# Patient Record
Sex: Female | Born: 1965 | Race: White | Hispanic: No | Marital: Married | Smoking: Never smoker
Health system: Southern US, Community
[De-identification: ages and names within clinical notes are randomized; demographics above are authoritative.]

## PROBLEM LIST (undated history)

## (undated) DIAGNOSIS — N809 Endometriosis, unspecified: Secondary | ICD-10-CM

## (undated) DIAGNOSIS — M1990 Unspecified osteoarthritis, unspecified site: Secondary | ICD-10-CM

## (undated) DIAGNOSIS — E079 Disorder of thyroid, unspecified: Secondary | ICD-10-CM

## (undated) DIAGNOSIS — K429 Umbilical hernia without obstruction or gangrene: Secondary | ICD-10-CM

## (undated) DIAGNOSIS — J45909 Unspecified asthma, uncomplicated: Secondary | ICD-10-CM

## (undated) HISTORY — PX: TYMPANOSTOMY TUBE PLACEMENT: SHX32

## (undated) HISTORY — DX: Unspecified osteoarthritis, unspecified site: M19.90

## (undated) HISTORY — PX: TOTAL SHOULDER ARTHROPLASTY: SHX126

## (undated) HISTORY — DX: Unspecified asthma, uncomplicated: J45.909

## (undated) HISTORY — DX: Umbilical hernia without obstruction or gangrene: K42.9

## (undated) HISTORY — DX: Endometriosis, unspecified: N80.9

## (undated) HISTORY — DX: Disorder of thyroid, unspecified: E07.9

---
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
## 1987-08-13 DIAGNOSIS — N809 Endometriosis, unspecified: Secondary | ICD-10-CM

## 1987-08-13 HISTORY — DX: Endometriosis, unspecified: N80.9

## 1988-08-12 HISTORY — PX: APPENDECTOMY: SHX54

## 1988-08-12 HISTORY — PX: CHOLECYSTECTOMY: SHX55

## 1998-12-29 ENCOUNTER — Encounter: Payer: Self-pay | Admitting: *Deleted

## 1998-12-29 ENCOUNTER — Ambulatory Visit (HOSPITAL_COMMUNITY): Admission: RE | Admit: 1998-12-29 | Discharge: 1998-12-29 | Payer: Self-pay | Admitting: *Deleted

## 1999-08-13 DIAGNOSIS — K429 Umbilical hernia without obstruction or gangrene: Secondary | ICD-10-CM

## 1999-08-13 HISTORY — DX: Umbilical hernia without obstruction or gangrene: K42.9

## 1999-08-13 HISTORY — PX: UMBILICAL HERNIA REPAIR: SHX196

## 2003-05-20 ENCOUNTER — Emergency Department (HOSPITAL_COMMUNITY): Admission: EM | Admit: 2003-05-20 | Discharge: 2003-05-20 | Payer: Self-pay | Admitting: Emergency Medicine

## 2004-04-23 ENCOUNTER — Ambulatory Visit (HOSPITAL_COMMUNITY): Admission: RE | Admit: 2004-04-23 | Discharge: 2004-04-23 | Payer: Self-pay | Admitting: *Deleted

## 2004-04-30 ENCOUNTER — Encounter: Admission: RE | Admit: 2004-04-30 | Discharge: 2004-07-29 | Payer: Self-pay | Admitting: *Deleted

## 2004-05-04 ENCOUNTER — Ambulatory Visit (HOSPITAL_COMMUNITY): Admission: RE | Admit: 2004-05-04 | Discharge: 2004-05-04 | Payer: Self-pay | Admitting: *Deleted

## 2004-05-04 ENCOUNTER — Encounter: Payer: Self-pay | Admitting: Cardiology

## 2004-05-07 ENCOUNTER — Ambulatory Visit (HOSPITAL_COMMUNITY): Admission: RE | Admit: 2004-05-07 | Discharge: 2004-05-07 | Payer: Self-pay | Admitting: *Deleted

## 2004-08-12 HISTORY — PX: BREAST LUMPECTOMY: SHX2

## 2005-07-12 ENCOUNTER — Encounter: Admission: RE | Admit: 2005-07-12 | Discharge: 2005-07-12 | Payer: Self-pay | Admitting: Orthopedic Surgery

## 2005-08-12 HISTORY — PX: ROUX-EN-Y GASTRIC BYPASS: SHX1104

## 2006-01-10 ENCOUNTER — Emergency Department (HOSPITAL_COMMUNITY): Admission: EM | Admit: 2006-01-10 | Discharge: 2006-01-10 | Payer: Self-pay | Admitting: Emergency Medicine

## 2006-05-23 ENCOUNTER — Encounter: Admission: RE | Admit: 2006-05-23 | Discharge: 2006-08-21 | Payer: Self-pay | Admitting: *Deleted

## 2006-07-22 ENCOUNTER — Inpatient Hospital Stay (HOSPITAL_COMMUNITY): Admission: RE | Admit: 2006-07-22 | Discharge: 2006-07-24 | Payer: Self-pay | Admitting: *Deleted

## 2006-07-27 ENCOUNTER — Inpatient Hospital Stay (HOSPITAL_COMMUNITY): Admission: EM | Admit: 2006-07-27 | Discharge: 2006-07-30 | Payer: Self-pay | Admitting: Emergency Medicine

## 2006-10-01 ENCOUNTER — Encounter: Admission: RE | Admit: 2006-10-01 | Discharge: 2006-12-30 | Payer: Self-pay | Admitting: *Deleted

## 2007-04-09 ENCOUNTER — Encounter: Admission: RE | Admit: 2007-04-09 | Discharge: 2007-04-09 | Payer: Self-pay | Admitting: Rheumatology

## 2007-06-26 ENCOUNTER — Encounter: Admission: RE | Admit: 2007-06-26 | Discharge: 2007-06-26 | Payer: Self-pay | Admitting: Neurology

## 2007-07-14 ENCOUNTER — Encounter: Admission: RE | Admit: 2007-07-14 | Discharge: 2007-07-14 | Payer: Self-pay | Admitting: Neurology

## 2007-07-28 ENCOUNTER — Encounter: Admission: RE | Admit: 2007-07-28 | Discharge: 2007-07-28 | Payer: Self-pay | Admitting: Neurology

## 2007-09-21 ENCOUNTER — Encounter: Admission: RE | Admit: 2007-09-21 | Discharge: 2007-09-21 | Payer: Self-pay | Admitting: Neurosurgery

## 2007-10-05 ENCOUNTER — Ambulatory Visit (HOSPITAL_COMMUNITY): Admission: RE | Admit: 2007-10-05 | Discharge: 2007-10-05 | Payer: Self-pay | Admitting: Orthopedic Surgery

## 2007-10-29 HISTORY — PX: BACK SURGERY: SHX140

## 2007-11-03 ENCOUNTER — Inpatient Hospital Stay (HOSPITAL_COMMUNITY): Admission: RE | Admit: 2007-11-03 | Discharge: 2007-11-09 | Payer: Self-pay | Admitting: Orthopedic Surgery

## 2008-08-12 HISTORY — PX: ANTERIOR AND POSTERIOR VAGINAL REPAIR: SUR5

## 2008-08-24 IMAGING — CR DG CHEST 2V
2 series · 2 of 2 positions shown · non-contrast
Comparison: 01/10/06

CLINICAL DATA: Preoperative respiratory exam for spinal surgery.
 CHEST - 2 VIEW:

[view not recorded (1 of 2)]
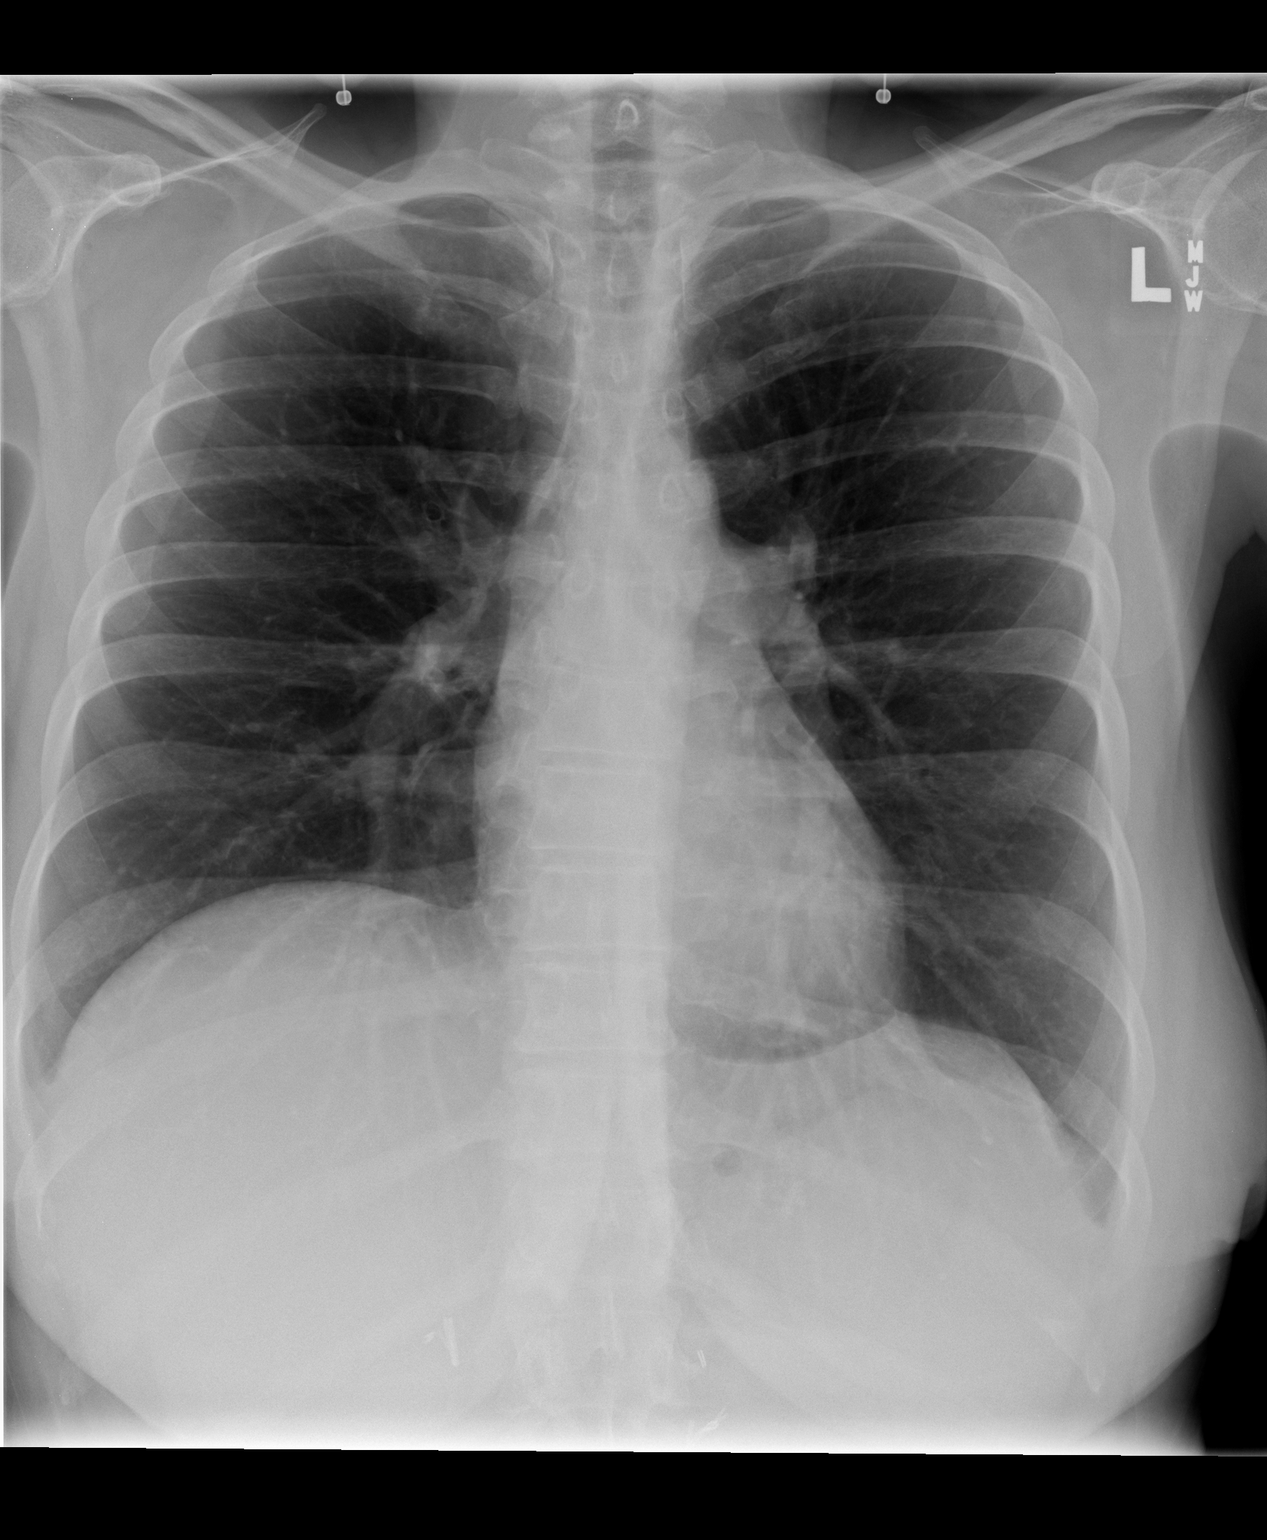

[view not recorded (2 of 2)]
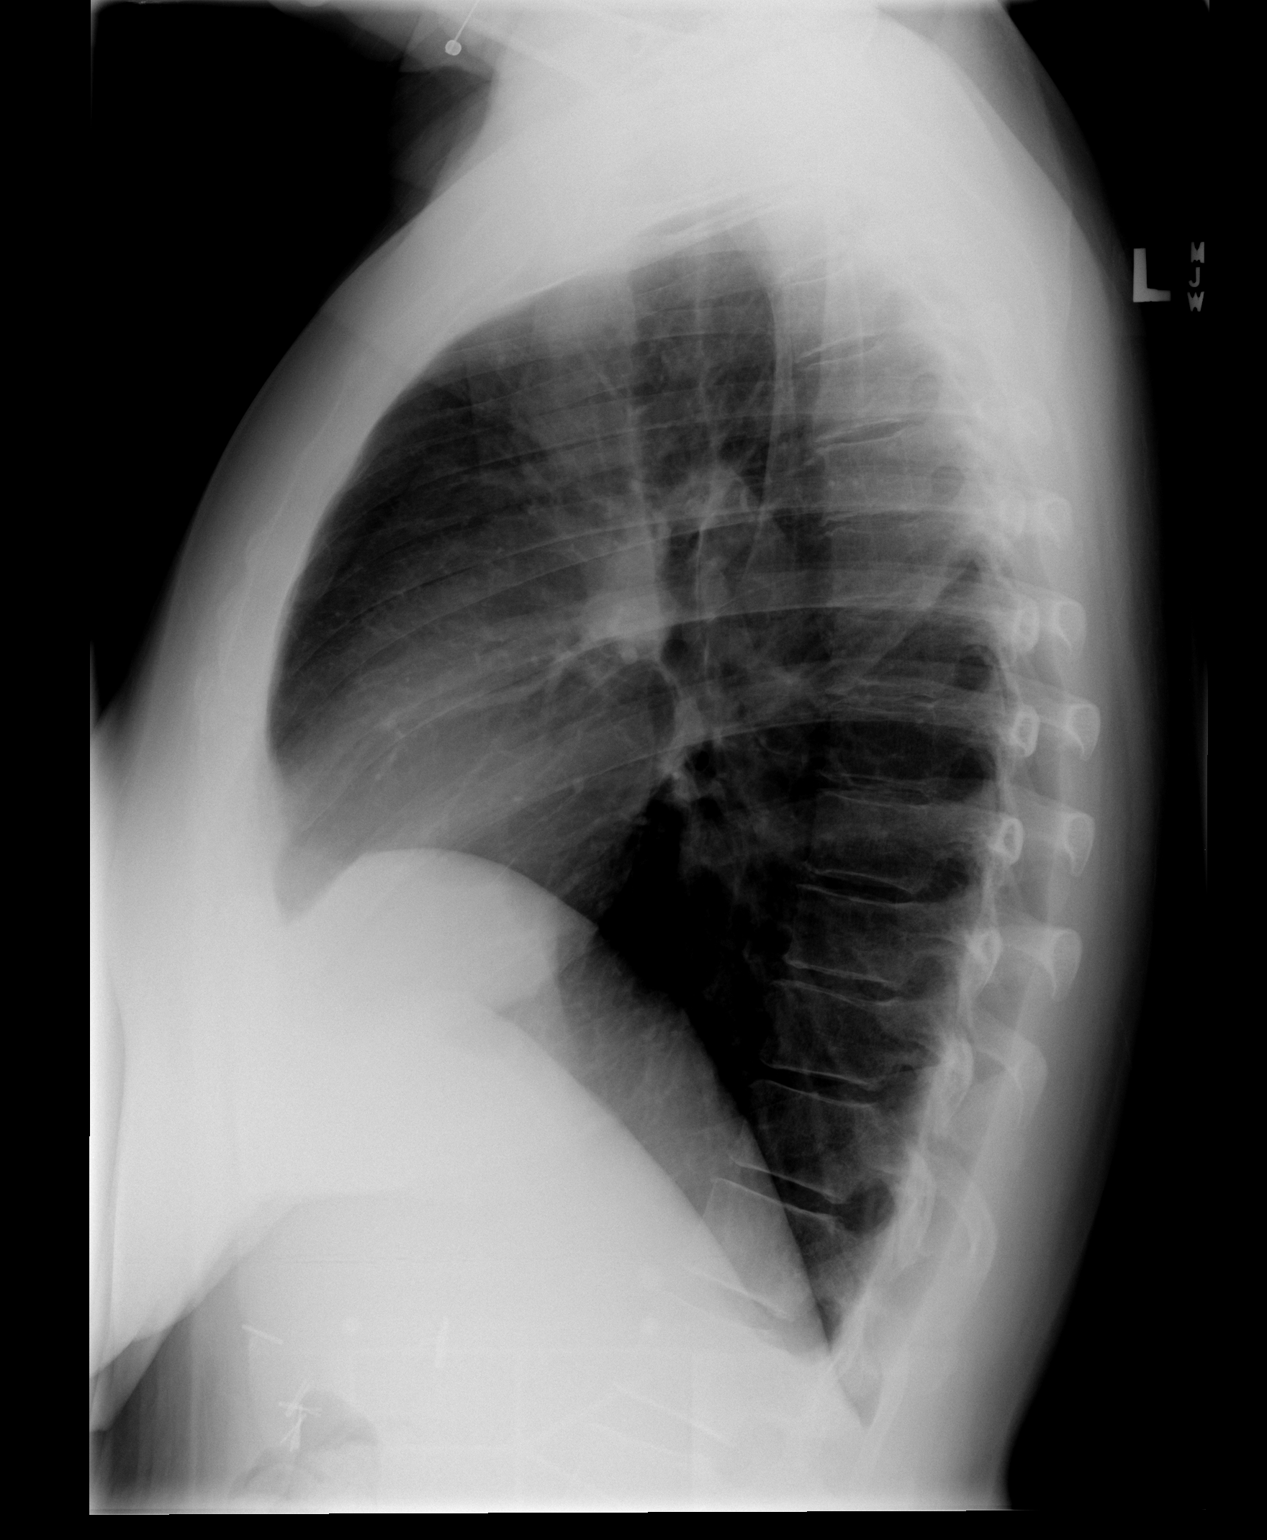

[2 of 2 positions shown; findings below may reference images not displayed]

FINDINGS: Heart size is normal. The mediastinum is unremarkable. The lungs are clear.  No effusions.  Bony structures unremarkable. Surgical clips noted in the abdomen.
IMPRESSION: No active disease.

## 2008-08-28 IMAGING — CR DG LUMBAR SPINE COMPLETE 4+V
1 series · 1 of 1 positions shown · non-contrast
Comparison: Radiographs [DATE] 1558 and MRI 04/09/2007

CLINICAL DATA: LUMBAR SPONDYLOSIS;  SURGICAL FUSION.  NEURAL COUNT
ON LAST FILM.

LUMBAR SPINE - COMPLETE 4+ VIEW

[view not recorded]
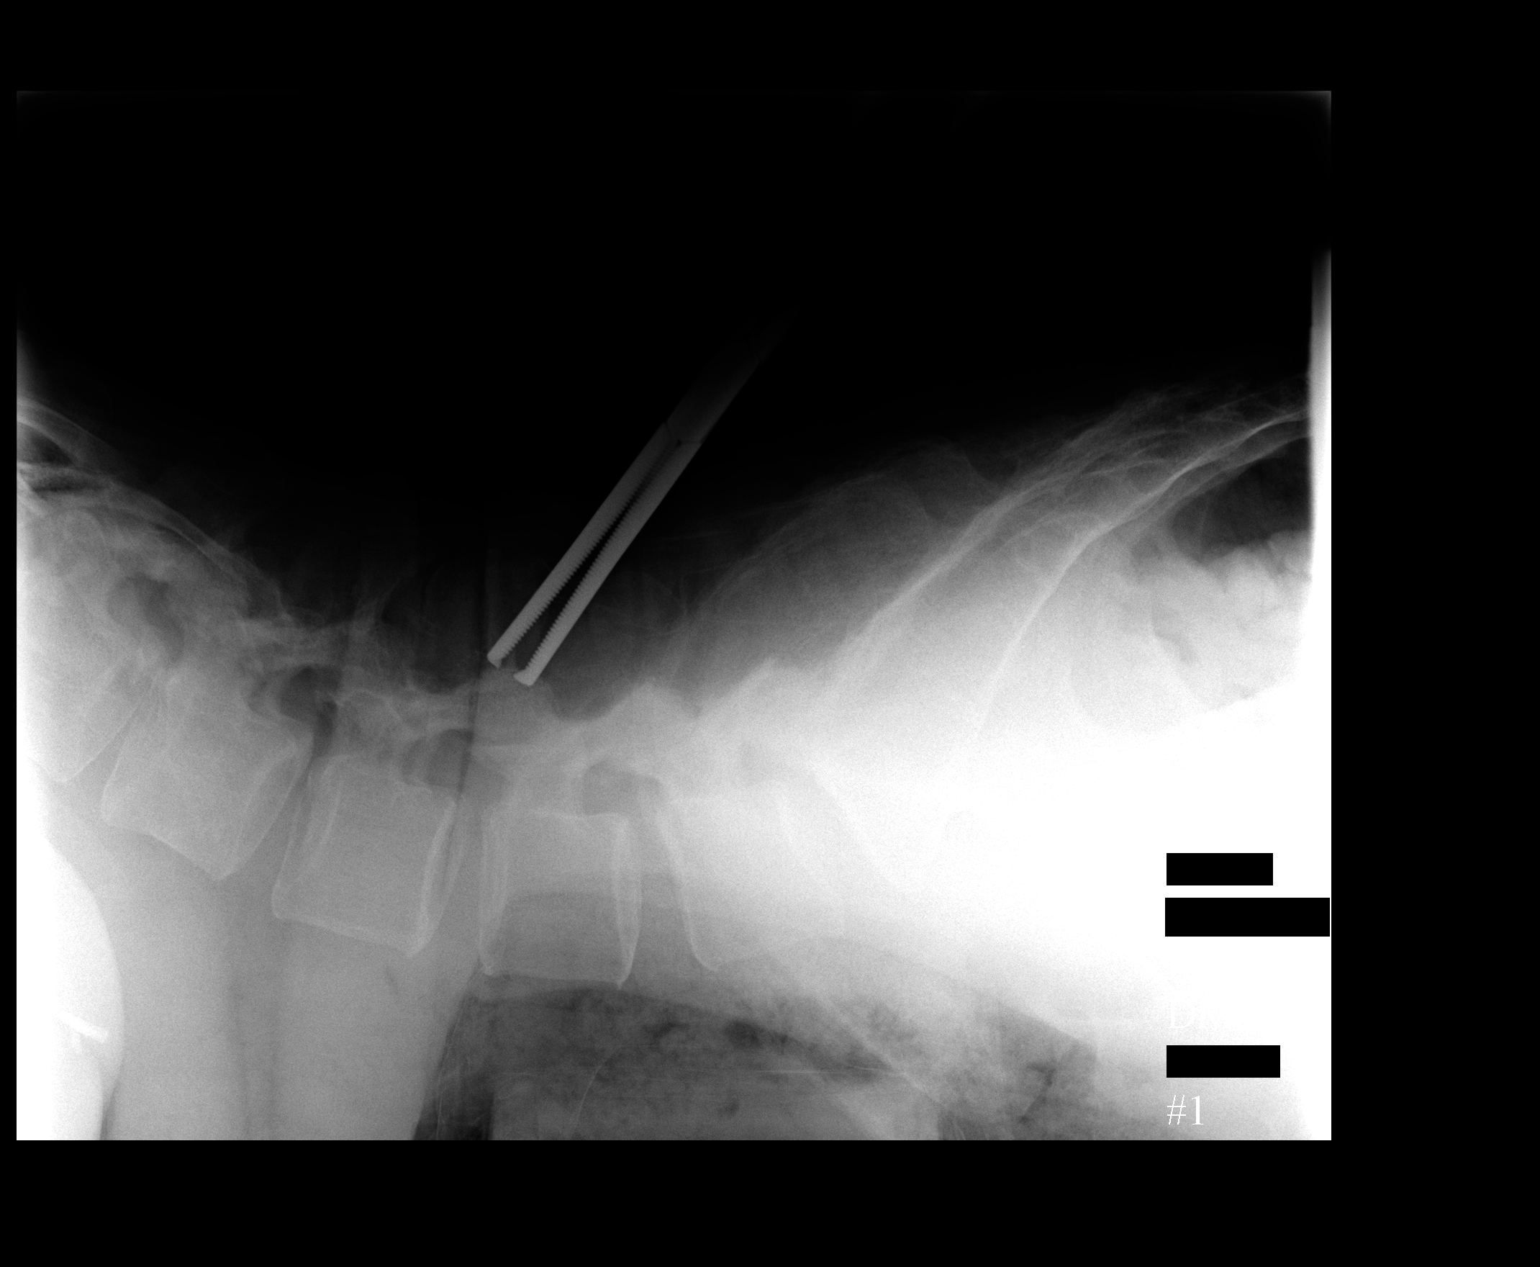

[1 of 1 positions shown; findings below may reference images not displayed]

FINDINGS: Initial film shows a clamp between the spinous processes
of L3 and L4.

A second film shows sounders in  place in the pedicles of L4 and
L5.

A third film shows pedicle screws and rods in place at L4 and L5 in
the ap projection.

The fourth film shows pedicle screws and posterior rods in place at
the L4-5 level on the lateral projection.

A 5th film is under penetrated.  Artifact overlies the region.  I
cannot see a needle, but this is not a high detail film.
IMPRESSION:

## 2009-03-12 HISTORY — PX: HERNIA REPAIR: SHX51

## 2009-08-12 HISTORY — PX: HERNIA REPAIR: SHX51

## 2009-08-12 HISTORY — PX: ABDOMINAL HYSTERECTOMY: SHX81

## 2010-05-12 HISTORY — PX: ANTERIOR AND POSTERIOR VAGINAL REPAIR: SUR5

## 2010-10-12 ENCOUNTER — Ambulatory Visit: Payer: Self-pay | Admitting: Hematology & Oncology

## 2010-10-29 ENCOUNTER — Other Ambulatory Visit: Payer: Self-pay | Admitting: Hematology & Oncology

## 2010-10-29 ENCOUNTER — Ambulatory Visit (HOSPITAL_BASED_OUTPATIENT_CLINIC_OR_DEPARTMENT_OTHER): Payer: BC Managed Care – PPO | Admitting: Hematology & Oncology

## 2010-10-29 DIAGNOSIS — D72819 Decreased white blood cell count, unspecified: Secondary | ICD-10-CM

## 2010-10-29 DIAGNOSIS — D696 Thrombocytopenia, unspecified: Secondary | ICD-10-CM

## 2010-10-29 DIAGNOSIS — D649 Anemia, unspecified: Secondary | ICD-10-CM

## 2010-10-29 LAB — MANUAL DIFFERENTIAL (CHCC SATELLITE)
ALC: 0.3 10*3/uL — ABNORMAL LOW (ref 0.6–2.2)
Eos: 2 % (ref 0–7)
Metamyelocytes: 1 % — ABNORMAL HIGH (ref 0–0)
Myelocytes: 1 % — ABNORMAL HIGH (ref 0–0)
Other Cells: 7 % — ABNORMAL HIGH (ref 0–0)
SEG: 48 % (ref 40–75)

## 2010-10-29 LAB — CBC WITH DIFFERENTIAL (CANCER CENTER ONLY)
MCHC: 33.1 g/dL (ref 32.0–36.0)
MCV: 94 fL (ref 81–101)
Platelets: 45 10*3/uL — ABNORMAL LOW (ref 145–400)
RDW: 15.5 % (ref 11.1–15.7)
WBC: 0.8 10*3/uL — CL (ref 3.9–10.0)

## 2010-10-31 LAB — PROTEIN ELECTROPHORESIS, SERUM
Beta 2: 6.2 % (ref 3.2–6.5)
Gamma Globulin: 10.1 % — ABNORMAL LOW (ref 11.1–18.8)

## 2010-10-31 LAB — COMPREHENSIVE METABOLIC PANEL
ALT: 17 U/L (ref 0–35)
AST: 15 U/L (ref 0–37)
Albumin: 3.9 g/dL (ref 3.5–5.2)
Calcium: 8.8 mg/dL (ref 8.4–10.5)
Chloride: 110 mEq/L (ref 96–112)
Potassium: 3.4 mEq/L — ABNORMAL LOW (ref 3.5–5.3)
Sodium: 142 mEq/L (ref 135–145)
Total Protein: 6.2 g/dL (ref 6.0–8.3)

## 2010-10-31 LAB — RETICULOCYTES (CHCC): ABS Retic: 32.1 10*3/uL (ref 19.0–186.0)

## 2010-10-31 LAB — LACTATE DEHYDROGENASE: LDH: 184 U/L (ref 94–250)

## 2010-11-01 ENCOUNTER — Ambulatory Visit (HOSPITAL_COMMUNITY): Payer: BC Managed Care – PPO | Attending: Hematology & Oncology

## 2010-11-01 ENCOUNTER — Other Ambulatory Visit: Payer: Self-pay | Admitting: Hematology & Oncology

## 2010-11-01 DIAGNOSIS — C9200 Acute myeloblastic leukemia, not having achieved remission: Secondary | ICD-10-CM | POA: Insufficient documentation

## 2010-11-01 DIAGNOSIS — D61818 Other pancytopenia: Secondary | ICD-10-CM | POA: Insufficient documentation

## 2010-11-01 LAB — CBC
MCH: 30.3 pg (ref 26.0–34.0)
MCHC: 31.9 g/dL (ref 30.0–36.0)
RDW: 15.5 % (ref 11.5–15.5)

## 2010-11-07 LAB — JAK-2 V617F

## 2010-11-08 NOTE — Op Note (Signed)
  NAME:  Cindy Nunez, Cindy Nunez               ACCOUNT NO.:  [id number]  MEDICAL RECORD NO.:  [id number]           PATIENT TYPE:  O  LOCATION:  MDC                          FACILITY:  [facility]  PHYSICIAN:  Rose Phi. Myna Hidalgo, M.D. DATE OF BIRTH:  Dec 23, 1965  DATE OF PROCEDURE:  11/01/2010 DATE OF DISCHARGE:                              OPERATIVE REPORT   PROCEDURE:  Left posterior iliac crest bone marrow biopsy and aspirate.  [name] was brought to short stay unit.  She had an IV placed without any difficulty.  Her Mallampati score was 1.  Her ASA class was 1.  We did the appropriate time-out procedure.  She was then placed onto her right side.  She received a total of 6 mg of Versed and 25 mg Demerol for IV sedation.  The left posterior iliac crest region was prepped and draped in sterile fashion.  10 cc of 2% lidocaine was infiltrated on the skin down to the periosteum.  I did need an 22-gauge spinal needle to reach the periosteum.  A #11 scalpel was used to make an incision into the skin.  We obtained 2 excellent bone marrow biopsy and 2 bone marrow aspirates without any difficulty.  We then used a Jamshidi biopsy needle.  We obtained an excellent bone marrow biopsy core.  The patient tolerated the procedure well.  Despite her thrombocytopenia, there was no bleeding that occurred.     Rose Phi. Myna Hidalgo, M.D.     PRE/MEDQ  D:  11/01/2010  T:  11/01/2010  Job:  [id number]  Electronically Signed by Arlan Organ  on 11/08/2010 07:47:44 AM

## 2010-12-25 NOTE — Op Note (Signed)
Cindy Nunez, Cindy Nunez NO.:  [id number]   MEDICAL RECORD NO.:  [id number]          PATIENT TYPE:  INP   LOCATION:  2550                         FACILITY:  [facility]   PHYSICIAN:  Nelda Severe, MD      DATE OF BIRTH:  1966-02-14   DATE OF PROCEDURE:  DATE OF DISCHARGE:  11/03/2007                               OPERATIVE REPORT   SURGEON:  Nelda Severe, M.D.   ASSISTANT:  Lianne Cure, PA-C.   PREOPERATIVE DIAGNOSIS:  Lumbar spondylosis, L4-5.   POSTOPERATIVE DIAGNOSIS:  Lumbar spondylosis, L4-5.   OPERATION/PROCEDURE:  Posterolateral and posterior fusion, L4-5, with  right posterior iliac crest autogenous graft, infuse, pedicle screws and  rods (in thickness).   DESCRIPTION OF PROCEDURE:  The patient was identified in the  preoperative room and paperwork signed including consent.  The informed  consent process was  previously executed in the office.  In the  operating room she is placed under general endotracheal anesthesia.  Prophylactic intravenous antibiotics had been infused.  A Foley catheter  was placed in the bladder.  Bilateral sequential compression devices  were placed on both lower extremities.   She was positioned prone on the [place] frame.  Care was taken to  position the upper extremities so as to avoid hyperflexion and abduction  of shoulders and so as to avoid hyperflexion of the elbows.  Upper  extremities were padded with foam from axilla to hands.  There was no  pressure on the cubital tunnels.  The hips and knees were gently flexed.  Thighs and knees and feet were supported on pillows.   The site of the proposed midline lumbar incision was marked with a skin  marker.  The lumbar area was prepped with DuraPrep and draped in  rectangular fashion.  The drapes were secured with Ioban.   The skin incision was scored and the dermis and subcuticular tissue  injected with a mixture of 0.25% plain Marcaine and 1% lidocaine with  epinephrine.   Incision was carried down to the spinous processes of the  distal lumbar spine.  A Kocher clamp was placed on the trailing edge of  what turned out to be the L3 lamina and an x-ray taken identifying this.  By this time the x-ray returned.  We exposed the lumbar spine  bilaterally to the sacrum and the position of the Kocher on the L3  lamina was consistent with our impression of the last mobile segment at  L5-S1.   We then exposed the transverse processes of L4-L5 bilaterally.   I then made pedicle holes on the left side at L5 and L4.  This was done  in the usual fashion by using a high-speed bur to expose the posterior  pedicle at the junction of the transverse process and superior articular  process.  A perforation was made with an awl and then the pedicle-  seeking probe introduced to make a pedicle hole through the pedicle into  the vertebral body.  The hole was carefully sounded with a ball-tip  probe and measured for depth and the depths recorded.  In the same way a  pedicle hole was created a left L4, right L5 and right L4.  Metal  markers were placed in each hole and cross-table lateral radiograph  taken which showed satisfactory position of markers.   In the meantime we harvested the right posterior iliac crest graft  through the same incision.  A flap of the skin and subcutaneous tissue  was raised and the iliac crest approach with cutting current.  The  gluteal fascia was detached from the outer iliac crest, right side.  The  muscle was then elevated off the outer table of the ilium and a Taylor  retractor replaced.  A medium gouge was then used to harvest a  moderately large quantity of cortical cancellous graft.  The graft site  was irrigated with saline and then the bony defect packed with Gelfoam  to control bleeding.  Gluteal fascia was reattached to the iliac crest  using three #1 Vicryl sutures in figure-of-eight fashion.   Next we used a high-speed bur to  decorticate the transverse processes on  the right side of the L4 and L5 in the lateral aspect of the superior  articular processes of L4 and L5.  Moderate quantity of morcellized  cancellous graft was then packed in posterolaterally.  We then placed  screws, 6.5 mm in diameter, the length determined by the depth  measurements at L5 and L4.  These were stimulated and distal EMG  activity monitored.  The level which distal EMG activity was elicited  was in the safe zone. There was very little likelihood of contact  between screw thread and nerve.  A 40 mm rod was then provisionally  attached.   We then moved back to the left side of the table where exactly the same  procedure was carried out, decorticating the transverse processes and  then lateral aspect of the superior articular processes.  Again graft  was placed posterolaterally.  Screws were then inserted and stimulated.  Current level at which distal EMG activity was elicited was in a safe  zone.  We then provisionally attached a rod.   At this point cross-table lateral and PA radiographs were taken, showed  satisfactory position of implants.   The couplings were then all torqued.   Next I resected the articular surfaces of the apophyseal joints at L4-5  bilaterally using a high-speed bur and  decorticated the lamina of L4-  L5.  I then had had prepared from a small pack of infuse to collagen  fiber strips saturated with infuse.  Bone was packed in the defect on  the right and then on the left, that is in facet joint resected area.  I  then packed in the rest of the graft superficially, interposing it into  the defect at the facet joints bilaterally in contact with the infuse  soaked collagen sponge.   We then placed a 15 Blake drain subfascially and brought out through the  skin to the right side.  It was secured with a 2-0 nylon suture.  The  thoracolumbar fascia was then closed using continuous #1 Vicryl suture.  A 1/8-inch  Hemovac drain was then placed in the subcutaneous layer and  brought out just to the right at the very proximal end of the wound.  We  then tacked down the skin/subcutaneous flap and used to approach the  iliac crest wound to the thoracolumbar fascia using interrupted 2-0  Vicryl sutures.  The subcutaneous layer of the wound was  then closed  side-to-side using interrupted 2-0 Vicryl suture in inverted fashion.  The skin was then closed using a subcuticular 3-0 undyed Vicryl suture  in continuous fashion, taking great care to try to match up the tattoo  through which the incision made, a butterfly.  The 1/8-inch Hemovac  drain was secured with a #2-0 nylon suture in basket weave fashion.  The  skin edges were reinforced with Steri-Strips.  An antibiotic ointment  dressing was applied and secured with OpSite.   The blood loss estimated at 200 mL.  The patient was stable throughout  the procedure.  There was no intraoperative complication.   The needle count was incorrect, apparently because of a miscount at the  beginning of the case, because all the needles that we had used were in  fact accounted for.  Therefore, at the time of dictation repeat PA  radiograph is being taken with the patient still in anesthetized  position and draped to make sure that there is no retained needle.   Not mentioned above is the fact that a time-out was held prior to making  the incision at which time the fascia was identified, the preoperative  diagnosis confirmed and the proposed surgery confirmed.      Nelda Severe, MD  Electronically Signed     MT/MEDQ  D:  11/03/2007  T:  11/03/2007  Job:  [id number]

## 2010-12-25 NOTE — Discharge Summary (Signed)
NAMESENDY, PLUTA NO.:  [id number]   MEDICAL RECORD NO.:  [id number]          PATIENT TYPE:  INP   LOCATION:  5037                         FACILITY:  [facility]   PHYSICIAN:  Nelda Severe, MD      DATE OF BIRTH:  28-May-1966   DATE OF ADMISSION:  11/03/2007  DATE OF DISCHARGE:  11/09/2007                               DISCHARGE SUMMARY   On November 03, 2007, Cindy Nunez was brought to [facility] with  preoperative diagnosis of lumbar spondylosis L4-5.   OPERATIVE PROCEDURE:  Posterolateral fusion L4-5 with right iliac crest  graft, pedicle screws, and rods.    She had minimal blood loss 200 mL.  Postoperatively, bilateral lower  extremity neurovascular and motor intact.  She was admitted to [facility], room 5037 in stable condition.  Postop day 1 decreased  blood pressure, systolic had fallen in PACU down to 50s, currently at 94  and ranged at 80/52.  Drain 150 mL.  Distally, she was neurovascularly  intact.  Pending PT evaluation.  Postop day 2, continued low blood  pressure.  We did get a medical consult from [facility] on November 04, 2007, to work up for low blood pressure.  She was asymptomatic as far as  her blood pressure was concerned and dizziness.  No shortness of breath.  O2 saturations in the mid 90s.  Her hemoglobin was stable.  On November 05, 2007, the patient was comfortable.  The IV came out.  Her blood pressure  was from 70 to 85 systolic and 48 to 60 diastolic, heart rate 106 to  130.  Drain output 175 mL.  Urine output was 3200.  She was  neurovascularly intact and asymptomatic from blood pressure changes.  She did have Restoril for sleep and had a better night's sleep.  She was  watched closely by medical doctor.  They did order a UA.  She did have a  UTI and was put on Cipro for 3 days.  On November 06, 2007, blood pressure  was still in the low range 74/51, heart rate 106, white count of 7.8,  hemoglobin stable at 9.6.  Discontinued  the Foley, discontinued the  drains.  We did have to order IM injections of morphine 10 mg with  Phenergan secondary to no IV access currently and still being worked up  for hypotensionby primary with no changes in other statuses.  Postop day  4 she was afebrile and vital signs were stable.  She started to complain  of left gluteal pain.  We did start her on Neurontin 600 t.i.d.,  gave  her a dose of Toradol per the medical doctor as well as restart IV and  change the IM injection to IV push for pain control.  Today, on November 09, 2007, the patient is afebrile.  Vital signs are stable.  Blood  pressure 90/64, platelet count 169, hemoglobin 8.7.  She states the left  hip pain is doing better.  She did have a good night's sleep.   </Final Diagnosis  Lumbar spondylosis L4-5  PLAN:  Discharge home on Neurontin 600 mg t.i.d., Robaxin 500 mg 1-2 q.6  h., Norco 10/325 one to two q.4 p.r.n. for pain control.  Walk for  exercise.  No bending, stooping, lifting, squatting.  We are going to  get her bedside toilet as well as rolling walker.  She will come back to  the office in 3 weeks.  If she has troubles or concerns, she is welcome  to call our office.  No dressing is necessary.  She may shower.      Lianne Cure, P.A.      Nelda Severe, MD  Electronically Signed    MC/MEDQ  D:  11/09/2007  T:  11/10/2007  Job:  [id number]

## 2010-12-25 NOTE — Consult Note (Signed)
NAMEJENELLA, Cindy Nunez               ACCOUNT NO.:  [id number]   MEDICAL RECORD NO.:  [id number]          PATIENT TYPE:  INP   LOCATION:  5037                         FACILITY:  [facility]   PHYSICIAN:  Hettie Holstein, D.O.    DATE OF BIRTH:  February 25, 1966   DATE OF CONSULTATION:  11/04/2007  DATE OF DISCHARGE:                                 CONSULTATION   PRIMARY CARE PHYSICIAN:  Dr. Lodema Pilot at [facility].   PRIMARY NEUROLOGIST:  Dr. Thad Ranger in [facility].   PSYCHIATRIST:  Dr. Carolanne Grumbling.   REQUESTING PHYSICIAN:  Dr. Nelda Severe.   REASON FOR CONSULTATION:  Hypotension/low blood pressure.   HISTORY OF PRESENT ILLNESS:  Cindy Nunez is a pleasant 45 year old female  who is postoperative day number 1.  Status post lumbar fusion with  pedicle screws, graft and rods, who sought relief through [facility] and underwent the procedure yesterday where she had  as described above lumbar fusion with pedicle screws.  Intraoperative  report was reviewed.  Total volume in was 2200, volume out 200 plus 200  estimated blood loss.  Vancomycin was provided perioperatively.  Her  blood pressure was in the marginal range throughout surgery, though her  systolic blood pressure predominantly remained in the 80-110 range.  Since the procedure, she has been relatively asymptomatic.  She has  received IV fluids.  She had a total output of approximately 3 liters  yesterday and she has had approximately 2.5 liters in today.  She  describes a history of chronically low blood pressures without symptoms  in the outpatient setting.  She does continue to remain asymptomatic  here, though slightly tachycardic.  She does have the appearance of  clinical volume depletion with dry oral mucosa.  She is appropriately  receiving IV fluids at this time.   PAST MEDICAL HISTORY:  1. Significant for depression requiring hospitalization in 2004.  2. Fibromyalgia as well as chronic fatigue  syndrome.  3. She has had a history of gastric bypass in 2007.  4. Hypothyroidism.  5. Status post partial hysterectomy.  6. Status post cholecystectomy and appendectomy.  7. History of migraine headaches.  8. Lumpectomy of her left breast in 2006.  9. C. section in 1993.  10.History of endometriosis and a right ovarian cyst 1989.  11.Status post gastric bypass in 2007.   HOME MEDICATIONS:  1. Cymbalta 60 mg b.i.d.  2. Vitamin D 50,000 units daily.  3. Wellbutrin 300 mg q.a.m.  4. Vitamin C 500 mg daily.  5. Synthroid 75 mcg q.a.m.  6. Lamictal 200 mg p.o. q.p.m.  7. Calcium supplement q.a.m.  8. Seroquel 100 mg daily.  9. Lyrica 100 mg t.i.d.  10.Vicodin 10 two every 4 hours.  11.Topamax 200 mg b.i.d.  12.Flexeril 10 mg daily.  13.Relpax 40 mg as needed.  14.Toradol 10 mg as needed.  15.Phenergan 25 mg as needed.   ALLERGIES:  1,  PENICILLIN, she develops hives.  1. CODEINE and ASPIRIN resulting in GI upset.   SOCIAL HISTORY:  She denies tobacco or alcohol.   FAMILY HISTORY:  Noncontributory, though her father did die with  pancreatic cancer.   REVIEW OF SYSTEMS:  She has been in her usual state of health without  nausea, diarrhea, chest pain or shortness of breath.   PHYSICAL EXAMINATION:  VITAL SIGNS:  Her systolic was in the 80-90s.  She was tachycardiac in the 100-110 range.  She was afebrile.  GENERAL:  She appeared in no acute distress.  She was up in a chair and  giving herself a bath.  HEENT:  Revealed her head to be normocephalic, atraumatic.  Extraocular  muscles were intact.  NECK:  Supple and nontender.  No palpable thyromegaly or mass.  CARDIOVASCULAR:  Revealed normal S1-S2, slightly tachycardiac without  murmur.  No carotid bruits to auscultation.  LUNGS:  Clear bilaterally without dullness to percussion.  ABDOMEN:  Soft, obese, nontender.  LOWER EXTREMITIES:  She had TED hose applied and there was no evidence  of considerable edema.   LABORATORY  DATA:  Sodium was 136, potassium 370, BUN 7, creatinine 0.61,  glucose 114, WBC of 6.6, hemoglobin 10.6, platelet count 115, MCV is 94.   ASSESSMENT:  1. Hypotension.  Suspect this is somewhat exacerbated by volume      depletion.  She does have historically low baseline blood pressure.      I am not certain if this is related to the multiple psychotropic      medications she is on.  I will review these.  I would recommend      gentle IV fluids and track her I's and O's as well as follow her      basic metabolic panel.  2. Postoperative day number 1 of lumbar fusion.  3. Depression on multiple pharmacotherapy.  4. History of migraines, stable at this time.  5. Thrombocytopenia.  Would follow up a CBC in the morning as well as      assess folic acid and B12 levels.   PLAN:  At this time, I would recommend gentle IV fluids as is ordered  and follow Cindy Nunez's I's and O's closely and also track her basic  metabolic panel.  We will order some other tests to perhaps rule out  other potential causes for her hypotension.  Thank you for this  consultation.      Hettie Holstein, D.O.  Electronically Signed     ESS/MEDQ  D:  11/04/2007  T:  11/04/2007  Job:  [id number]

## 2010-12-28 NOTE — Op Note (Signed)
Cindy Nunez, Cindy Nunez               ACCOUNT NO.:  [id number]   MEDICAL RECORD NO.:  [id number]          PATIENT TYPE:  INP   LOCATION:  0001                         FAC[facility]   PHYSICIAN:  Alfonse Ras, MD   DATE OF BIRTH:  1965-11-17   DATE OF PROCEDURE:  07/22/2006  DATE OF DISCHARGE:                               OPERATIVE REPORT   PREOPERATIVE DIAGNOSIS:  Morbid obesity, medically refractory.   POSTOPERATIVE DIAGNOSIS:  Morbid obesity, medically refractory.   PROCEDURES:  Laparoscopic Roux-en-Y gastric bypass, antegastric,  antecolic with a left facing Roux limb.   SURGEON:  Baruch Merl, MD.   ASSISTANTThornton Park.  Daphine Deutscher, MD.   ANESTHESIA:  General.   DESCRIPTION OF PROCEDURE:  The patient was taken to the operating room,  placed in a supine position and after adequate anesthesia was induced  using endotracheal tube, the abdomen was prepped and draped in normal  sterile fashion. Using a 12 mm Optiview in the left upper quadrant,  peritoneal access was obtained under direct vision.  Pneumoperitoneum  was obtained.  Additional 12 mm trocars were placed in the right  abdomen.  There were a number of adhesions up to the liver and anterior  abdominal wall from a previous open cholecystectomy. These were taken  down with the harmonic scalpel.  Additional 12 and 5 mm ports were  placed.  The ligament of Treitz was then identified and the small bowel  was transected using a 45-mm white load GIA stapling device for 40 cm  distal to the ligament of Treitz.  The distal end was tagged using a  Penrose drain for the Roux limb.  Counting an additional 100 cm distal  to this, a side-to-side jejunojejunostomy was performed in the standard  fashion using a 45-mm white load GIA stapling device after enterotomies  were made.  The defect was closed with a hand-sewn running 2-0 Vicryl  suture.  The mesenteric defect was closed with a 2-0 silk suture.  The  suture line was  reinforced with Tisseel.   I then turned my attention to the upper abdomen, the patient was placed  in extreme head-up position.  A Nathanson liver retractor was placed in  the upper abdomen and a left lateral segment of the liver was retracted  anteriorly.  The angle of His was dissected using Bovie electrocautery.  The lesser curve was identified and coming down about 45 cm from the EG  junction, the lesser sac was entered using the harmonic scalpel.  A  retrogastric dissection was performed.  The pouch was then created with  serial firings x3, first gold and then 2 blue load GIA stapling devices.  The Ewald tube was placed down into the gastric pouch.  The omentum was  divided because of tension on the Roux limb using the harmonic scalpel.  The posterior layer of 2-0 Vicryl suture approximated the Roux limb to  the pouch, however, there was still some amount of tension on it.  A  gastrotomy and enterotomy was made using the harmonic scalpel.  The  anastomosis was created using  a 45 mm GIA stapling device.  The defect  was closed with a running 2-0 Vicryl suture.  Because of the tension on  the pouch, I did not feel appropriate to do an anterior layer so at this  point, I clamped off the distal small bowel and Dr. Daphine Deutscher performed  upper endoscopy.  This showed no evidence of leak.  An omental patch was  created and Tisseel was placed over the anastomosis.  A drain was placed  in the left upper quadrant.  The Peterson's defect was closed with a  running 2-0 silk suture.  Of note, the remnant staple line  was oversewn with a running interlocking 2-0 silk suture.  [facility]  liver retractor was removed, pneumoperitoneum was released. The skin  incisions were closed with staples.  The patient tolerated the procedure  well and went to PACU in good condition.      Alfonse Ras, MD  Electronically Signed     KRE/MEDQ  D:  07/22/2006  T:  07/22/2006  Job:  [id number]

## 2010-12-28 NOTE — Discharge Summary (Signed)
Cindy Nunez, Cindy Nunez               ACCOUNT NO.:  [id number]   MEDICAL RECORD NO.:  [id number]          PATIENT TYPE:  INP   LOCATION:  1329                         FACILITY:  [facility]   PHYSICIAN:  Alfonse Ras, MD   DATE OF BIRTH:  04/21/1966   DATE OF ADMISSION:  07/27/2006  DATE OF DISCHARGE:  07/30/2006                               DISCHARGE SUMMARY   ADMISSION DIAGNOSIS:  Abdominal pain, status post laparoscopic Roux-en-Y  gastric bypass, rule out leak.   DISCHARGE DIAGNOSES:  No evidence of leak, probable partial small bowel  obstruction which has resolved.   CONDITION ON DISCHARGE:  Good and improved.   DISPOSITION:  Discharged to home.   DISCHARGE MEDICATIONS:  1. Topamax 200 mg b.i.d.  2. Wellbutrin 200 mg b.i.d.  3. Lexapro 20 mg daily.  4. Lamictal 200 mg daily.   HOSPITAL COURSE:  The patient was admitted about five days status post  laparoscopic Roux-en-Y gastric bypass by Dr. Ezzard Standing.  The patient came  in complaining of abdominal pain, nausea and vomiting and diarrhea.  Work-up showed a CT scan which showed air along the drain track and a  proximal small bowel obstruction.  Followup films, however, showed  resolution of this small bowel obstruction over the next two days.  Her  heart rate remained in the 90s to 100 range.  Her white count was always  normal; actually dropped to 3.1 and then on hospital day #3 it was up to  4.6.  She was doing much better and was tolerating her protein shakes  and was ready for discharge home on hospital #3.  She was discharged to  home. Staples were removed prior to discharge.  She will follow up with  me in three weeks.      Alfonse Ras, MD  Electronically Signed     KRE/MEDQ  D:  07/30/2006  T:  07/30/2006  Job:  [id number]
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
## 2011-05-06 LAB — URINE CULTURE: Colony Count: NO GROWTH

## 2011-05-06 LAB — CBC
HCT: 25.6 — ABNORMAL LOW
HCT: 25.9 — ABNORMAL LOW
HCT: 39.4
Hemoglobin: 8.6 — ABNORMAL LOW
Hemoglobin: 8.7 — ABNORMAL LOW
MCHC: 34
MCV: 92
MCV: 92.6
MCV: 93.1
MCV: 93.4
MCV: 93.9
Platelets: 115 — ABNORMAL LOW
Platelets: 121 — ABNORMAL LOW
Platelets: 121 — ABNORMAL LOW
Platelets: 148 — ABNORMAL LOW
Platelets: 169
Platelets: 173
Platelets: 183
RBC: 3.4 — ABNORMAL LOW
RDW: 13.1
RDW: 13.2
RDW: 13.4
WBC: 4.3
WBC: 4.9
WBC: 6.6
WBC: 7.8

## 2011-05-06 LAB — PROTIME-INR
INR: 1
Prothrombin Time: 13
Prothrombin Time: 13.7

## 2011-05-06 LAB — BASIC METABOLIC PANEL
BUN: 6
BUN: 6
BUN: 7
BUN: 7
BUN: 8
BUN: 9
CO2: 22
Calcium: 8.1 — ABNORMAL LOW
Calcium: 8.2 — ABNORMAL LOW
Calcium: 8.5
Chloride: 108
Chloride: 111
Chloride: 112
Chloride: 116 — ABNORMAL HIGH
Creatinine, Ser: 0.61
Creatinine, Ser: 0.62
Creatinine, Ser: 0.68
Creatinine, Ser: 0.72
GFR calc Af Amer: >60
GFR calc Af Amer: >60
GFR calc non Af Amer: >60
GFR calc non Af Amer: >60
GFR calc non Af Amer: >60
GFR calc non Af Amer: >60
Glucose, Bld: 137 — ABNORMAL HIGH
Glucose, Bld: 94
Potassium: 3.4 — ABNORMAL LOW
Potassium: 3.7
Potassium: 4
Sodium: 143

## 2011-05-06 LAB — URINALYSIS, ROUTINE W REFLEX MICROSCOPIC
Bilirubin Urine: NEGATIVE
Bilirubin Urine: NEGATIVE
Hgb urine dipstick: NEGATIVE
Hgb urine dipstick: NEGATIVE
Ketones, ur: NEGATIVE
Nitrite: NEGATIVE
Nitrite: NEGATIVE
Protein, ur: NEGATIVE
Specific Gravity, Urine: 1.024
Urobilinogen, UA: 0.2
Urobilinogen, UA: 0.2

## 2011-05-06 LAB — ABO/RH: ABO/RH(D): O POS

## 2011-05-06 LAB — CORTISOL-AM, BLOOD: Cortisol - AM: 30.8 — ABNORMAL HIGH

## 2011-05-06 LAB — HEMOGLOBIN A1C
Hgb A1c MFr Bld: 4.9
Mean Plasma Glucose: 97

## 2011-05-06 LAB — COMPREHENSIVE METABOLIC PANEL
Albumin: 3.1 — ABNORMAL LOW
BUN: 14
Calcium: 8.5
Creatinine, Ser: 0.64
Total Protein: 5.6 — ABNORMAL LOW

## 2011-05-06 LAB — TYPE AND SCREEN
ABO/RH(D): O POS
Antibody Screen: NEGATIVE

## 2011-05-06 LAB — DIFFERENTIAL
Basophils Absolute: 0
Lymphocytes Relative: 34
Monocytes Absolute: 0.3
Monocytes Relative: 7
Neutro Abs: 2.9

## 2011-05-06 LAB — FERRITIN: Ferritin: 155 (ref 10–291)

## 2011-05-06 LAB — RETICULOCYTES
RBC.: 3.27 — ABNORMAL LOW
Retic Ct Pct: 0.6

## 2011-05-06 LAB — TSH: TSH: 0.453

## 2011-05-06 LAB — CORTISOL: Cortisol, Plasma: 10.8

## 2011-05-06 LAB — CREATININE, URINE, RANDOM: Creatinine, Urine: 39.6

## 2011-05-06 LAB — VITAMIN B12: Vitamin B-12: 856 (ref 211–911)

## 2011-05-06 LAB — URINE MICROSCOPIC-ADD ON

## 2011-07-13 HISTORY — PX: ROTATOR CUFF REPAIR: SHX139

## 2012-02-07 ENCOUNTER — Encounter (INDEPENDENT_AMBULATORY_CARE_PROVIDER_SITE_OTHER): Payer: Self-pay | Admitting: General Surgery

## 2012-02-07 ENCOUNTER — Encounter (INDEPENDENT_AMBULATORY_CARE_PROVIDER_SITE_OTHER): Payer: Self-pay | Admitting: Surgery

## 2012-02-07 ENCOUNTER — Ambulatory Visit (INDEPENDENT_AMBULATORY_CARE_PROVIDER_SITE_OTHER): Payer: BC Managed Care – PPO | Admitting: Surgery

## 2012-02-07 ENCOUNTER — Other Ambulatory Visit (INDEPENDENT_AMBULATORY_CARE_PROVIDER_SITE_OTHER): Payer: Self-pay | Admitting: General Surgery

## 2012-02-07 VITALS — BP 102/68 | HR 80 | Temp 99.4°F | Resp 16 | Ht 67.0 in | Wt 230.0 lb

## 2012-02-07 DIAGNOSIS — Z9884 Bariatric surgery status: Secondary | ICD-10-CM

## 2012-02-07 DIAGNOSIS — C9241 Acute promyelocytic leukemia, in remission: Secondary | ICD-10-CM | POA: Insufficient documentation

## 2012-02-07 DIAGNOSIS — C9201 Acute myeloblastic leukemia, in remission: Secondary | ICD-10-CM

## 2012-02-07 NOTE — Addendum Note (Signed)
Addended by: Latricia Heft on: 02/07/2012 02:23 PM   Modules accepted: Orders

## 2012-02-07 NOTE — Progress Notes (Signed)
Cindy Nunez 46 y.o.  Body mass index is 36.02 kg/(m^2).  There is no problem list on file for this patient.   Allergies  Allergen Reactions  . Asa (Aspirin) Anaphylaxis  . Codeine Anaphylaxis  . Penicillins Anaphylaxis  . Relpax (Eletriptan) Anaphylaxis  . Toradol (Ketorolac Tromethamine) Anaphylaxis    No past surgical history on file. ROBBINS,ROBERT A, MD No diagnosis found.  Cindy Nunez hasn't been seen since October 2009.  At that time she had lost 151 pounds and her weight loss was about about 170 and her nadir.  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 She's been antral losing that. In the meantime she is undergone some body sculpting by plastic surgeon down in [place]. She really like her to lose some of the weight back and she gained.  I am going to refer her for nutritional counseling with dietary and try to get her back and her postop bariatric diet. In addition we will make were recommendations for the belt program. I'll see her back in 4 months to see how she has done with weight loss Matt B. Daphine Deutscher, MD, [facility] [phone] beeper [phone]  02/07/2012 2:11 PM

## 2012-02-07 NOTE — Patient Instructions (Addendum)
Dietary appointment BELT referral Followup in 4 months  Diet Following Bariatric Surgery The bariatric diet is designed to provide fluids and nourishment while promoting weight loss after bariatric surgery. The diet is divided into 3 stages. The rate of progression varies based on individual food tolerance. DIET FOLLOWING BARIATRIC SURGERY The diet following surgery is divided into 3 stages to allow a gradual adjustment. It is very important to the success of your surgery to:  Progress to each stage slowly.   Eat at set times.   Chew food well and stop eating when you are full.   Not drink liquids 30 minutes before and after meals.  If you feel tightness or pressure in your chest, that means you are full. Wait 30 minutes before you try to eat again. STAGE 1 BARIATRIC DIET - ABOUT 2 WEEKS IN DURATION   The diet begins the day of surgery. It will last about 1 to 2 weeks after surgery. Your surgeon may have individual guidelines for you about specific foods or the progression of your diet. Follow your surgeon's guidelines.   If clear liquids are well-tolerated without vomiting, your caregiver will add a 4 oz to 6 oz high protein, low-calorie liquid supplement. You could add this to your meal plan 3 times daily. You will need at least 60 g to 80 g of protein daily or as determined by your Registered Dietitian.   Guidelines for choosing a protein supplement include:   At least 15 g of protein per 8 oz serving.   Less than 20 g total carbohydrate per 8 oz serving.   Less than 5 g fat per 8 oz serving.   Avoid carbonated beverages, caffeine, alcohol, and concentrated sweets such as sugar, cakes, and cookies.   Right after surgery, you may only be able to eat 3 to 4 tsp per meal. Your maximum volume should not exceed  to  cup total. Do not eat or drink more than 1 oz or 2 tbs every 15 minutes.   Take a chewable multivitamin and mineral supplement.   Drink at least 48 oz of fluid  daily, which includes your protein supplement.  Food and beverages from the list below are allowed at set times (for example at 8 AM, 12 noon, or 5 PM):  Decaffeinated coffee or tea.   100% fruit juice.   Diet or sugar-free drinks.   Broth.   Blenderized soup.   Skim milk or lactose-free milk.   Sugar-free gelatin dessert or frozen ice pops.   Mashed potatoes.   Yogurt (artificially sweetened).   Sugar-free pudding.   Blended low-fat cottage cheese.   Unsweetened applesauce, grits, or hot wheat cereal.  Four to six ounces of a liquid protein supplement from the list below is recommended for snacks at 10 AM, 2 PM, and 8 PM.  STAGE 2 BARIATRIC DIET (SOFT DIET) - ABOUT 4 WEEKS IN DURATION  About 2 weeks after surgery, your caregiver will progress your diet to this stage. Foods may need to be blended to the consistency of applesauce. Choose low-fat foods (less than 5 g of fat per serving) and avoid concentrated sweets and sugar (less than 10 g of sugar per serving). Meals should not exceed  to  cup total. This stage will last about 4 weeks. It is recommended that you meet with your dietitian at this stage to begin preparation for the last stage. This stage consists of 3 meals a day with a liquid protein supplement between meals twice  daily. Do not drink liquids with foods. You must wait 30 minutes for the stomach pouch to empty before drinking. Chew food well. The food must be almost liquified before swallowing. Soft foods from the list below can now be slowly added to your diet:  Soft fruit (soft canned fruit in light syrup or natural juice, banana, melon, peaches, pears, or strawberries).   Cooked vegetables.   Toast or crackers (becomes soft after chewing 20 times).   Hot wheat cereal.   Fish.   Eggs (scrambled, soft-boiled).  STAGE 3 BARIATRIC DIET (REGULAR DIET) - ABOUT 6 to 8 WEEKS AFTER SURGERY About 6 to 8 weeks after surgery, you will be advanced to food that is  regular in texture. This diet should include all food groups. The diet will continue to promote weight loss. Meals should not exceed  to 1 cup total. Your dietitian will be available to assist you in meal planning and additional behavioral strategies to make this final stage a long-term success. Slowly add foods of regular consistency and remember:  Eat only at your chosen meal times.   Minimize drinking with meals. You should drink 30 minutes before eating. Do not start drinking again for about 2 hours after eating.   Chew food well. Take small bites.   Think about the portion size of a healthy frozen meal. You will be able to eat most of this.   Make sure your meal is balanced with starch, protein, fruits, and vegetables.   When you feel full, stop eating.  Document Released: 02/02/2003 Document Revised: 07/18/2011 Document Reviewed: 10/26/2010 [facility] Patient Information 2012 [place], [place].

## 2012-03-30 ENCOUNTER — Encounter: Payer: Self-pay | Admitting: *Deleted

## 2012-03-30 ENCOUNTER — Encounter: Payer: BC Managed Care – PPO | Attending: Surgery | Admitting: *Deleted

## 2012-03-30 VITALS — Ht 67.0 in | Wt 230.5 lb

## 2012-03-30 DIAGNOSIS — Z09 Encounter for follow-up examination after completed treatment for conditions other than malignant neoplasm: Secondary | ICD-10-CM | POA: Insufficient documentation

## 2012-03-30 DIAGNOSIS — E669 Obesity, unspecified: Secondary | ICD-10-CM

## 2012-03-30 DIAGNOSIS — Z9884 Bariatric surgery status: Secondary | ICD-10-CM | POA: Insufficient documentation

## 2012-03-30 DIAGNOSIS — Z713 Dietary counseling and surveillance: Secondary | ICD-10-CM | POA: Insufficient documentation

## 2012-03-30 NOTE — Patient Instructions (Addendum)
Goals:  Follow Bariatric Surgery Pre-Op Diet for 1 week  After, follow Bariatric Surgery Specialized Post-Op Diet until you return for follow up  Eat 3-6 small meals/snacks, every 3-5 hrs  Increase lean protein foods to meet 60-80g goal  Increase fluid intake to 64oz +  Add 15 grams of carbohydrate (fruit, whole grain, starchy vegetable) with meals  Avoid drinking 15 minutes before, during and 30 minutes after eating  Aim for >30 min of physical activity daily  AVOID ALL SWEETS!! :)

## 2012-03-30 NOTE — Progress Notes (Addendum)
Follow-up visit:  5.5 Years Post-Operative RYGB Surgery  Medical Nutrition Therapy:  Appt start time:  0800   End time:  0900.  Primary concerns today: Post-operative Bariatric Surgery Nutrition Management. Cindy Nunez comes today for a post-op refresher. She reports a dx of cancer (APL; 10/2010-07/2011) during which she was instructed to gain 50 lbs to tolerate chemotherapy. Her last treatment was in 07/2011 and she is having trouble losing the weight. Food recall shows high CHO snacking habit has continued and she has not been exercising recently. Discussed jump-starting weight loss by following pre-op diet for 1 week, then switching over to modified post-op. States she is leaving for a 3 week trip to [place] soon and plans to "eat/drink what I want and not worry about it" until she gets home. Will see her for f/u in 3 months.    Surgery date: 07/22/06 Start weight at [facility]: 165.0 lbs (lowest weight - 2009)  Weight today: 230.5 lbs BMI: 36.1 kg/m^2 Weight goal: 165 lbs   24-hr recall: First thing: Protein shake (pwder = 52g/scoop) B (AM): Apple and 20g protein bar Snk (AM): None  L (PM): Leftovers; 2-3 oz protein, veggies Snk (PM): "Anything sweet I can find" - 1/2 c ice cream or [employer] fried apple pies (2) D (PM): Baked chicken (2-3 oz), greens, carrots or brussel sprouts  Snk (PM): None or Popcorn  Fluid intake: 40-60 oz Estimated total protein intake: 80g +  Medications: See medication list; Reconciled with patient  Supplementation: Taking B-12 in pill form, one MVI daily, and 500 mg calcium carbonate daily. Discussed correct form of calcium to take (citrate), the current recommendations for RYGB supplementation, and example dosing schedules.   Using straws: No Drinking while eating: No, but takes sips 5-10 min after eating Hair loss: None Carbonated beverages: Rare N/V/D/C: No Dumping syndrome: Yes, during weight gain for chemo; None recently  Recent physical activity: Usually averages  5 times/week; none lately d/t depression from death of her dog   Progress Towards Goal(s):  In progress.  Handouts given during visit include:  Bariatric Surgery 2 Week Pre-Op Diet  Bariatric Surgery Specialized Post-Op Diet  Samples given during visit include:   Unjury Protein Powder (2 ea) Unflavored: Lot # [id number]; Exp: 09/14  (1 ea) Chicken Soup: Lot # [id number]; Exp: 10/14  Celebrate Vitamins Orange Burst: 3 ea Lot # [id number]; Exp: 10/14   Nutritional Diagnosis:  [place]-3.3 Overweight/obesity As related to excessive CHO and fat intake.  As evidenced by patient-reported dietary history and weight gain of 50 lbs.    Intervention:  Nutrition intervention.  Monitoring/Evaluation:  Dietary intake, exercise, and body weight. Follow up in 3 months

## 2012-04-01 ENCOUNTER — Encounter: Payer: Self-pay | Admitting: *Deleted

## 2012-06-30 ENCOUNTER — Ambulatory Visit: Payer: BC Managed Care – PPO | Admitting: *Deleted

## 2012-07-24 ENCOUNTER — Ambulatory Visit (INDEPENDENT_AMBULATORY_CARE_PROVIDER_SITE_OTHER): Payer: BC Managed Care – PPO | Admitting: Surgery

## 2014-12-09 ENCOUNTER — Emergency Department (HOSPITAL_COMMUNITY)
Admission: EM | Admit: 2014-12-09 | Discharge: 2014-12-09 | Disposition: A | Discharge status: Home / Self Care | Payer: Self-pay | Attending: Emergency Medicine | Admitting: Emergency Medicine

## 2014-12-09 ENCOUNTER — Encounter (HOSPITAL_COMMUNITY): Payer: Self-pay | Admitting: *Deleted

## 2014-12-09 DIAGNOSIS — R531 Weakness: Secondary | ICD-10-CM | POA: Insufficient documentation

## 2014-12-09 DIAGNOSIS — Z856 Personal history of leukemia: Secondary | ICD-10-CM | POA: Insufficient documentation

## 2014-12-09 DIAGNOSIS — Y9389 Activity, other specified: Secondary | ICD-10-CM | POA: Insufficient documentation

## 2014-12-09 DIAGNOSIS — J45909 Unspecified asthma, uncomplicated: Secondary | ICD-10-CM | POA: Insufficient documentation

## 2014-12-09 DIAGNOSIS — Y998 Other external cause status: Secondary | ICD-10-CM | POA: Insufficient documentation

## 2014-12-09 DIAGNOSIS — Z880 Allergy status to penicillin: Secondary | ICD-10-CM | POA: Insufficient documentation

## 2014-12-09 DIAGNOSIS — Z8739 Personal history of other diseases of the musculoskeletal system and connective tissue: Secondary | ICD-10-CM | POA: Insufficient documentation

## 2014-12-09 DIAGNOSIS — Z8719 Personal history of other diseases of the digestive system: Secondary | ICD-10-CM | POA: Insufficient documentation

## 2014-12-09 DIAGNOSIS — S199XXA Unspecified injury of neck, initial encounter: Secondary | ICD-10-CM | POA: Insufficient documentation

## 2014-12-09 DIAGNOSIS — S0990XA Unspecified injury of head, initial encounter: Secondary | ICD-10-CM | POA: Insufficient documentation

## 2014-12-09 DIAGNOSIS — Z79899 Other long term (current) drug therapy: Secondary | ICD-10-CM | POA: Insufficient documentation

## 2014-12-09 DIAGNOSIS — Y92410 Unspecified street and highway as the place of occurrence of the external cause: Secondary | ICD-10-CM | POA: Insufficient documentation

## 2014-12-09 DIAGNOSIS — E079 Disorder of thyroid, unspecified: Secondary | ICD-10-CM | POA: Insufficient documentation

## 2014-12-09 DIAGNOSIS — Z8742 Personal history of other diseases of the female genital tract: Secondary | ICD-10-CM | POA: Insufficient documentation

## 2014-12-09 DIAGNOSIS — G43909 Migraine, unspecified, not intractable, without status migrainosus: Secondary | ICD-10-CM | POA: Insufficient documentation

## 2014-12-09 MED ORDER — METOCLOPRAMIDE HCL 5 MG/ML IJ SOLN
10.0000 mg | Freq: Once | INTRAMUSCULAR | Status: AC
  Administered 2014-12-09: 10 mg via INTRAMUSCULAR
  Filled 2014-12-09: qty 2

## 2014-12-09 MED ORDER — ACETAMINOPHEN 500 MG PO TABS: 500.0000 mg | ORAL_TABLET | Freq: Four times a day (QID) | ORAL | Status: AC | PRN

## 2014-12-09 MED ORDER — DIPHENHYDRAMINE HCL 50 MG/ML IJ SOLN
50.0000 mg | Freq: Once | INTRAMUSCULAR | Status: AC
  Administered 2014-12-09: 50 mg via INTRAMUSCULAR
  Filled 2014-12-09: qty 1

## 2014-12-09 MED ORDER — OXYCODONE-ACETAMINOPHEN 5-325 MG PO TABS: 1.0000 | ORAL_TABLET | Freq: Once | ORAL | Status: DC

## 2014-12-09 MED ORDER — CYCLOBENZAPRINE HCL 10 MG PO TABS: 10.0000 mg | ORAL_TABLET | Freq: Every day | ORAL | Status: DC

## 2014-12-09 MED ORDER — KETOROLAC TROMETHAMINE 60 MG/2ML IM SOLN
60.0000 mg | Freq: Once | INTRAMUSCULAR | Status: DC
  Filled 2014-12-09: qty 2
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
## 2014-12-09 NOTE — ED Notes (Signed)
Patient ambulated to the restroom unassisted without difficulty.
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
## 2014-12-09 NOTE — Discharge Instructions (Signed)
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
## 2015-09-04 ENCOUNTER — Other Ambulatory Visit: Payer: Self-pay | Admitting: Orthopaedic Surgery

## 2015-09-04 DIAGNOSIS — M545 Low back pain: Secondary | ICD-10-CM

## 2015-09-14 ENCOUNTER — Other Ambulatory Visit: Payer: Self-pay

## 2015-09-15 ENCOUNTER — Ambulatory Visit
Admission: RE | Admit: 2015-09-15 | Discharge: 2015-09-15 | Disposition: A | Discharge status: Home / Self Care | Payer: 59 | Source: Ambulatory Visit | Attending: Orthopaedic Surgery | Admitting: Orthopaedic Surgery

## 2015-09-15 DIAGNOSIS — M545 Low back pain: Secondary | ICD-10-CM

## 2016-01-25 ENCOUNTER — Encounter: Payer: Self-pay | Admitting: Rheumatology

## 2016-06-03 ENCOUNTER — Telehealth: Payer: Self-pay | Admitting: Rheumatology

## 2016-06-03 MED ORDER — METHOCARBAMOL 750 MG PO TABS: 750.0000 mg | ORAL_TABLET | Freq: Two times a day (BID) | ORAL | 0 refills | Status: DC

## 2016-06-03 NOTE — Telephone Encounter (Signed)
Patient is requesting refill of methocarbamol.  [facility] in [place].

## 2016-06-03 NOTE — Telephone Encounter (Signed)
02/24/16 last visit  07/15/16 next visit  Ok to refill Robaxin

## 2016-06-04 ENCOUNTER — Ambulatory Visit (INDEPENDENT_AMBULATORY_CARE_PROVIDER_SITE_OTHER): Payer: Self-pay | Admitting: Orthopaedic Surgery

## 2016-06-17 LAB — HM MAMMOGRAPHY

## 2016-06-19 ENCOUNTER — Ambulatory Visit (INDEPENDENT_AMBULATORY_CARE_PROVIDER_SITE_OTHER): Payer: 59 | Admitting: Orthopaedic Surgery

## 2016-06-19 ENCOUNTER — Encounter (INDEPENDENT_AMBULATORY_CARE_PROVIDER_SITE_OTHER): Payer: Self-pay | Admitting: Orthopaedic Surgery

## 2016-06-19 VITALS — BP 107/74 | HR 84

## 2016-06-19 DIAGNOSIS — M545 Low back pain, unspecified: Secondary | ICD-10-CM

## 2016-06-19 DIAGNOSIS — G8929 Other chronic pain: Secondary | ICD-10-CM

## 2016-06-19 DIAGNOSIS — N6489 Other specified disorders of breast: Secondary | ICD-10-CM

## 2016-06-19 NOTE — Progress Notes (Signed)
Office Visit Note   Patient: Cindy Nunez           Date of Birth: Oct 09, 1965           MRN: [id number] Visit Date: 06/19/2016              Requested by: Myrlene Broker, MD [place], [place] [address] PCP: Myrlene Broker, MD   Assessment & Plan: Visit Diagnoses:  1. Chronic midline low back pain without sciatica   2. Pendulous breast     Plan: He'll long discussion with patient today regarding her chronic problem. I absolutely do feel that her pendulous breasts are definitely causing her ongoing back pain. She has tried multiple things to help this problem as documented in the history of present illness without success. From an orthopedic standpoint I definitely feel that it is a medical necessity for her to have breast reduction surgery to help decrease ongoing pain in her back as long as the surgery could be done without any problems. Advised patient that I cannot answer any questions about the risk of hyperprolactinemia and breast reduction surgery since that is not my field of specialty. I did discuss possibly getting a second opinion with another Psychiatric nurse. She will follow up in our office when necessary. Advised patient that she does not have a surgical problem from our standpoint that we can address. All questions answered.  Follow-Up Instructions: Return if symptoms worsen or fail to improve.   Orders:  No orders of the defined types were placed in this encounter.  No orders of the defined types were placed in this encounter.     Procedures: No procedures performed   Clinical Data: No additional findings.   Subjective: Chief Complaint  Patient presents with  . Spine - Pain    Doing  A little better. She quit going to [place]. She states they were hurting her. Went to [facility] and does exercises there. She states she likes [place] much better Very pleased. Sometimes she gets burning, tingling and numbness. Pain level today is  (4-5/10).    Patient states he continues to have ongoing upper the low back pain. Patient does have pendulous breasts and states that when she holds her breasts up this does help to relieve the stress on her back significantly but obviously this is only temporary because she cannot walk around in this manner. Patient has had custom bras made that did not offer dramatic improvement she's also has significant weight loss greater than 100 pounds with gastric bypass. States that her bra size is J. She's had formal PT in the past and in the present to strengthen her core and back. Patient has also been on various medications for back pain. She is currently being followed by plastic surgeon Doctor Morley Kos at [facility] and they have went over breast reduction surgery in great detail. Patient reports that surgery is being held at this time due to increased colostrum production and patient states that she has been told that there is some risk of having surgery with this. She has not had a second opinion. Patient states that she has also been seen by an endocrinologist and diagnosed with hyper prolactinemia and has been on medication for this but it caused some side effects. Ongoing back pain is having a significant negative impact on her quality of life. She is status post L4-5 fusion by Dr. took several years ago. Lumbar spine MRI 3/17 report read  posterolateral rod and pedicle screw fixation at L4-5 with solid end or facet fusion, and suspected microdiscectomy at this level, resulting in significant improvement in the prior foraminal impingement and no residual impingement. There does appear to be significant residual disc material between the vertebral bodies at this level and a small central disc protrusion. Mild disc bulge at L2-3 without impingement. Thoracic spine MRI for May 2017 report read no evidence of acute, subacute or chronic injury within the thoracic spine. No listhesis or malalignment. Very mild  degenerative disc bulge at T11-T12 without stenosis. Review of Systems  Constitutional: Negative.   HENT: Negative.   Respiratory: Negative.   Gastrointestinal: Negative.   Genitourinary: Negative.   Musculoskeletal: Positive for back pain.  Psychiatric/Behavioral: Negative.      Objective: Vital Signs: BP 107/74   Pulse 84   Physical Exam  Constitutional: She is oriented to person, place, and time. No distress.  HENT:  Head: Normocephalic and atraumatic.  Eyes: Pupils are equal, round, and reactive to light.  Neck: Normal range of motion.  Pulmonary/Chest: No respiratory distress.  Patient has very large pendulous breast.  Abdominal: She exhibits no distension.  Neurological: She is alert and oriented to person, place, and time.  Skin: Skin is warm.  Psychiatric: She has a normal mood and affect.    Ortho Exam Gait is normal. Chest thoracolumbar paraspinal tenderness. Negative log roll bilaterally. Negative straight leg raise. She is neurovascularly intact. No focal motor deficits. Specialty Comments:  No specialty comments available.  Imaging: No results found.   PMFS History: Patient Active Problem List   Diagnosis Date Noted  . 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[place]) 02/07/2012   Past Medical History:  Diagnosis Date  . 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 Arthritis   . Asthma   . Endometriosis 1989  . Hernia, umbilical [id number]  . Thyroid disease     Family History  Problem Relation Age of Onset  . Cancer Father     Pancreatic-small cell carcinoma  . Cancer Paternal Aunt   . Cancer Paternal Uncle   . Cancer Paternal Grandmother   . Cancer Paternal Grandfather     Past Surgical History:  Procedure Laterality Date  . ABDOMINAL HYSTERECTOMY  2001   Partial  . ANTERIOR AND POSTERIOR VAGINAL REPAIR  05/2010  . APPENDECTOMY  1990  . BACK SURGERY  10/29/07   L4, L5    . BREAST LUMPECTOMY  2006   Left breast; benign  . CESAREAN SECTION  1993  . CHOLECYSTECTOMY  1990  . HERNIA REPAIR  2011   Follow up  . HERNIA REPAIR  03/2009   Triple Hernia & Endomnioplasty (reported per pt)  . ROTATOR CUFF REPAIR  07/2011  . ROUX-EN-Y GASTRIC BYPASS  2007  . TONSILLECTOMY AND ADENOIDECTOMY  1979  . TYMPANOSTOMY [place], 1978  . UMBILICAL HERNIA REPAIR  2001   Social History   Occupational History  . Not on file.   Social History Main Topics  . Smoking status: Never Smoker  . Smokeless tobacco: Never Used  . Alcohol use No  . Drug use: No  . Sexual activity: Not on file

## 2016-07-11 ENCOUNTER — Encounter: Payer: Self-pay | Admitting: Rheumatology

## 2016-07-15 ENCOUNTER — Ambulatory Visit: Payer: Self-pay | Admitting: Rheumatology

## 2016-08-08 DIAGNOSIS — R5383 Other fatigue: Secondary | ICD-10-CM | POA: Insufficient documentation

## 2016-08-08 DIAGNOSIS — Z8781 Personal history of (healed) traumatic fracture: Secondary | ICD-10-CM | POA: Insufficient documentation

## 2016-08-08 DIAGNOSIS — E559 Vitamin D deficiency, unspecified: Secondary | ICD-10-CM | POA: Insufficient documentation

## 2016-08-08 DIAGNOSIS — M797 Fibromyalgia: Secondary | ICD-10-CM | POA: Insufficient documentation

## 2016-08-08 DIAGNOSIS — M47816 Spondylosis without myelopathy or radiculopathy, lumbar region: Secondary | ICD-10-CM | POA: Insufficient documentation

## 2016-08-08 DIAGNOSIS — G894 Chronic pain syndrome: Secondary | ICD-10-CM | POA: Insufficient documentation

## 2016-08-08 DIAGNOSIS — Z856 Personal history of leukemia: Secondary | ICD-10-CM | POA: Insufficient documentation

## 2016-08-08 DIAGNOSIS — F5101 Primary insomnia: Secondary | ICD-10-CM | POA: Insufficient documentation

## 2016-08-08 NOTE — Progress Notes (Addendum)
Office Visit Note  Patient: Cindy Nunez             Date of Birth: 1965-10-10           MRN: [id number]             PCP: Myrlene Broker, MD Referring: Myrlene Broker, MD Visit Date: 08/13/2016 Occupation:     Subjective:  Generalized pain   History of Present Illness: Cindy Nunez is a 50 y.o. female with history of fibromyalgia syndrome and disc disease. According to patient she's been going to a chiropractor and has been getting some physical therapy and massage therapy. It has been helpful for her. Despite of all this her pain level on 0-10 has been around 8. She continues to have a lot of fatigue. She has pain in her bilateral hips, bilateral knees and lower back pain.   Activities of Daily Living:  Patient reports morning stiffness for120 minutes.   Patient Denies nocturnal pain.  Difficulty dressing/grooming: Denies Difficulty climbing stairs: Denies Difficulty getting out of chair: Denies Difficulty using hands for taps, buttons, cutlery, and/or writing: Denies   Review of Systems  Constitutional: Positive for fatigue. Negative for night sweats, weight loss and weakness.  HENT: Positive for mouth dryness. Negative for mouth sores, trouble swallowing, trouble swallowing and nose dryness.   Eyes: Positive for dryness. Negative for pain, redness and visual disturbance.  Respiratory: Negative for cough, shortness of breath and difficulty breathing.   Cardiovascular: Negative for chest pain, palpitations, hypertension, irregular heartbeat and swelling in legs/feet.  Gastrointestinal: Positive for constipation. Negative for blood in stool and diarrhea.  Endocrine: Negative for increased urination.  Genitourinary: Negative for vaginal dryness.  Musculoskeletal: Positive for arthralgias, joint pain, myalgias, morning stiffness and myalgias. Negative for joint swelling, muscle weakness and muscle tenderness.  Skin: Negative for color change, rash, hair loss, skin  tightness, ulcers and sensitivity to sunlight.  Allergic/Immunologic: Negative for susceptible to infections.  Neurological: Negative for dizziness, memory loss and night sweats.  Hematological: Negative for swollen glands.  Psychiatric/Behavioral: Positive for depressed mood and sleep disturbance.    PMFS History:  Patient Active Problem List   Diagnosis Date Noted  . DDD (degenerative disc disease), 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  . History of migraine 02/07/2017  . Osteopenia  08/12/2016  . Obsessive-compulsive disorder 08/12/2016  . Anxiety 08/12/2016  . History of leukemia 08/08/2016  . Fibromyalgia 08/08/2016  . Chronic pain syndrome 08/08/2016  . 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  . History of vertebral fracture 08/08/2016  . Vitamin D deficiency 08/08/2016  . Other fatigue 08/08/2016  . Primary insomnia 08/08/2016  . 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[place]) 02/07/2012    Past Medical History:  Diagnosis Date  . 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 Arthritis   . Asthma   . Endometriosis 1989  . Hernia, umbilical [id number]  . Thyroid disease     Family History  Problem Relation Age of Onset  . Cancer Father        Pancreatic-small cell carcinoma  . Cancer Paternal Aunt   . Cancer Paternal Uncle   . Cancer Paternal Grandmother   . Cancer Paternal Grandfather   . Osteoporosis Mother   . Fibromyalgia Mother   . Osteoarthritis Mother    Past Surgical History:  Procedure Laterality Date  . ABDOMINAL HYSTERECTOMY  2001   Partial  .  ANTERIOR AND POSTERIOR VAGINAL REPAIR  05/2010  . APPENDECTOMY  1990  . BACK SURGERY  10/29/07   L4, L5  . BREAST LUMPECTOMY  2006   Left breast; benign  . CESAREAN SECTION  1993  . CHOLECYSTECTOMY  1990  . HERNIA REPAIR  2011   Follow up  . HERNIA REPAIR  03/2009   Triple Hernia & Endomnioplasty (reported per pt)  .  ROTATOR CUFF REPAIR  07/2011  . ROUX-EN-Y GASTRIC BYPASS  2007  . TONSILLECTOMY AND ADENOIDECTOMY  1979  . TYMPANOSTOMY [place], 1978  . UMBILICAL HERNIA REPAIR  2001   Social History   Social History Narrative  . No narrative on file     Objective: Vital Signs: BP 111/79 (BP Location: Left Arm, Patient Position: Sitting, Cuff Size: Normal)   Pulse (!) 101   Resp 14   Ht 5\' 7"  (1.702 m)   Wt 230 lb (104.3 kg)   BMI 36.02 kg/m    Physical Exam  Constitutional: She is oriented to person, place, and time. She appears well-developed and well-nourished.  HENT:  Head: Normocephalic and atraumatic.  Eyes: Conjunctivae and EOM are normal.  Neck: Normal range of motion.  Cardiovascular: Normal rate, regular rhythm, normal heart sounds and intact distal pulses.   Pulmonary/Chest: Effort normal and breath sounds normal.  Abdominal: Soft. Bowel sounds are normal.  Lymphadenopathy:    She has no cervical adenopathy.  Neurological: She is alert and oriented to person, place, and time.  Skin: Skin is warm and dry. Capillary refill takes less than 2 seconds.  Psychiatric: She has a normal mood and affect. Her behavior is normal.  Nursing note and vitals reviewed.    Musculoskeletal Exam: C-spine and thoracic lumbar spine some discomfort with range of motion and limited. She has good range of motion of her bilateral shoulders elbows wrist joints MCPs PIPs DIPs. Hip joints knee joints ankles MTPs PIPs with good range of motion. She had tenderness on palpation over bilateral trochanteric bursa consistent with trochanteric bursitis.  CDAI Exam: No CDAI exam completed.    Investigation: Findings:  DEXA -1.5 T score 03/08/16 left femur 01/25/2016 CBC normal, CMP glucose 131, vitamin D 43    Imaging: No results found.  Speciality Comments: No specialty comments available.    Procedures:  Large Joint Inj Date/Time: 08/13/2016 3:25 PM Performed by: Bo Merino Authorized by: Bo Merino   Consent Given by:  Patient Site marked: the procedure site was marked   Timeout: prior to procedure the correct patient, procedure, and site was verified   Indications:  Pain Location:  Hip Site:  L greater trochanter Prep: patient was prepped and draped in usual sterile fashion   Needle Size:  27 G Needle Length:  1.5 inches Approach:  Lateral Ultrasound Guidance: No   Fluoroscopic Guidance: No   Arthrogram: No   Medications:  40 mg triamcinolone acetonide 40 MG/ML; 1.5 mL lidocaine 1 % Aspiration Attempted: No   Aspirate amount (mL):  0 Patient tolerance:  Patient tolerated the procedure well with no immediate complications Large Joint Inj Date/Time: 08/13/2016 3:25 PM Performed by: Bo Merino Authorized by: Bo Merino   Consent Given by:  Patient Site marked: the procedure site was marked   Timeout: prior to procedure the correct patient, procedure, and site was verified   Indications:  Pain Location:  Hip Site:  R greater trochanter Prep: patient was prepped and draped in usual sterile fashion   Needle  Size:  27 G Needle Length:  1.5 inches Approach:  Lateral Ultrasound Guidance: No   Fluoroscopic Guidance: No   Arthrogram: No   Medications:  40 mg triamcinolone acetonide 40 MG/ML; 1.5 mL lidocaine 1 % Aspiration Attempted: No   Aspirate amount (mL):  0 Patient tolerance:  Patient tolerated the procedure well with no immediate complications   Allergies: Asa [aspirin]; Codeine; Penicillins; Relpax [eletriptan]; Toradol [ketorolac tromethamine]; and Milk-related compounds   Assessment / Plan:     Visit Diagnoses: Trochanteric bursitis of both hips: She's been having bilateral trochanteric bursa pain and nocturnal pain. She is having difficulty walking and sitting for prolonged period. After different treatment options were discussed and informed consent was obtained bilateral trochanteric bursa were injected as  described above. She tolerated the procedure well.  Fibromyalgia: She is doing overall better after care from the chiropractor also she continues to have generalized pain.  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 Use of calcium and vitamin D and resistive exercises were discussed.  Vitamin D deficiency  History of leukemia - acute promyelocytic leukemia f/u at [facility]  Obsessive-compulsive disorder  Anxiety - with depression  Migraines - On hydrocodone when necessary    Orders: Orders Placed This Encounter  Procedures  . Large Joint Injection/Arthrocentesis  . Large Joint Injection/Arthrocentesis   Meds ordered this encounter  Medications  . methocarbamol (ROBAXIN) 750 MG tablet    Sig: Take 1 tablet (750 mg total) by mouth 2 (two) times daily.    Dispense:  180 tablet    Refill:  1  . cyclobenzaprine (FLEXERIL) 10 MG tablet    Sig: Take 1 tablet (10 mg total) by mouth at bedtime.    Dispense:  30 tablet    Refill:  6  . lidocaine (XYLOCAINE) 1 % (with pres) injection 1.5 mL  . lidocaine (XYLOCAINE) 1 % (with pres) injection 1.5 mL  . triamcinolone acetonide (KENALOG-40) injection 40 mg  . triamcinolone acetonide (KENALOG-40) injection 40 mg    Face-to-face time spent with patient was 30 minutes. 50% of time was spent in counseling and coordination of care.  Follow-Up Instructions: Return in about 6 months (around 02/10/2017) for fibromyalgia, DDD.   Bo Merino, MD

## 2016-08-12 DIAGNOSIS — M85862 Other specified disorders of bone density and structure, left lower leg: Secondary | ICD-10-CM | POA: Insufficient documentation

## 2016-08-12 DIAGNOSIS — F419 Anxiety disorder, unspecified: Secondary | ICD-10-CM | POA: Insufficient documentation

## 2016-08-12 DIAGNOSIS — F429 Obsessive-compulsive disorder, unspecified: Secondary | ICD-10-CM | POA: Insufficient documentation

## 2016-08-13 ENCOUNTER — Encounter: Payer: Self-pay | Admitting: Rheumatology

## 2016-08-13 ENCOUNTER — Ambulatory Visit (INDEPENDENT_AMBULATORY_CARE_PROVIDER_SITE_OTHER): Payer: 59 | Admitting: Rheumatology

## 2016-08-13 VITALS — BP 111/79 | HR 101 | Resp 14 | Ht 67.0 in | Wt 230.0 lb

## 2016-08-13 DIAGNOSIS — Z856 Personal history of leukemia: Secondary | ICD-10-CM | POA: Diagnosis not present

## 2016-08-13 DIAGNOSIS — Z8781 Personal history of (healed) traumatic fracture: Secondary | ICD-10-CM | POA: Diagnosis not present

## 2016-08-13 DIAGNOSIS — F5101 Primary insomnia: Secondary | ICD-10-CM | POA: Diagnosis not present

## 2016-08-13 DIAGNOSIS — E559 Vitamin D deficiency, unspecified: Secondary | ICD-10-CM

## 2016-08-13 DIAGNOSIS — F429 Obsessive-compulsive disorder, unspecified: Secondary | ICD-10-CM

## 2016-08-13 DIAGNOSIS — M7061 Trochanteric bursitis, right hip: Secondary | ICD-10-CM

## 2016-08-13 DIAGNOSIS — M797 Fibromyalgia: Secondary | ICD-10-CM

## 2016-08-13 DIAGNOSIS — M47816 Spondylosis without myelopathy or radiculopathy, lumbar region: Secondary | ICD-10-CM | POA: Diagnosis not present

## 2016-08-13 DIAGNOSIS — G894 Chronic pain syndrome: Secondary | ICD-10-CM

## 2016-08-13 DIAGNOSIS — M7062 Trochanteric bursitis, left hip: Secondary | ICD-10-CM

## 2016-08-13 DIAGNOSIS — R5383 Other fatigue: Secondary | ICD-10-CM | POA: Diagnosis not present

## 2016-08-13 DIAGNOSIS — F419 Anxiety disorder, unspecified: Secondary | ICD-10-CM | POA: Diagnosis not present

## 2016-08-13 DIAGNOSIS — G43009 Migraine without aura, not intractable, without status migrainosus: Secondary | ICD-10-CM

## 2016-08-13 DIAGNOSIS — M85862 Other specified disorders of bone density and structure, left lower leg: Secondary | ICD-10-CM | POA: Diagnosis not present

## 2016-08-13 MED ORDER — METHOCARBAMOL 750 MG PO TABS: 750.0000 mg | ORAL_TABLET | Freq: Two times a day (BID) | ORAL | 1 refills | Status: DC

## 2016-08-13 MED ORDER — TRIAMCINOLONE ACETONIDE 40 MG/ML IJ SUSP
40.0000 mg | INTRAMUSCULAR | Status: AC | PRN
  Administered 2016-08-13: 40 mg via INTRA_ARTICULAR

## 2016-08-13 MED ORDER — LIDOCAINE HCL 1 % IJ SOLN
1.5000 mL | INTRAMUSCULAR | Status: AC | PRN
  Administered 2016-08-13: 1.5 mL

## 2016-08-13 MED ORDER — CYCLOBENZAPRINE HCL 10 MG PO TABS: 10.0000 mg | ORAL_TABLET | Freq: Every day | ORAL | 6 refills | Status: DC

## 2016-08-23 ENCOUNTER — Telehealth: Payer: Self-pay | Admitting: Pharmacist

## 2016-08-23 NOTE — Telephone Encounter (Signed)
Received fax from [facility] regarding potential drug-drug interaction between patient's duloxetine and cyclobenzaprine.  Codaministration may increase the risk of serotonin syndrome.  Patient is currently prescribed duloxetine 60 mg daily and cyclobenzaprine 10 mg QHS as needed.  Called patient to discuss.  Reviewed with patient that the combined use of duloxetine and cyclobenzaprine may increase risk of serotonin syndrome.  Patient reports she has been on both medications for a prolonged period of time without any adverse effects.  I reviewed the signs and symptoms of serotonin syndrome and advised patient to only use cyclobenzaprine as needed.  Patient voiced understanding and denied any questions or concerns regarding her medications.    Elisabeth Most, Pharm.D., BCPS Clinical Pharmacist Pager: [phone] Phone: [phone] 08/23/2016 3:20 PM

## 2016-10-14 ENCOUNTER — Other Ambulatory Visit: Payer: Self-pay | Admitting: Rheumatology

## 2016-10-14 NOTE — Telephone Encounter (Signed)
Last Visit: 01/25/16 Next visit: 02/11/17  Okay to refill Lyrica?

## 2016-10-14 NOTE — Telephone Encounter (Signed)
ok 

## 2016-10-16 ENCOUNTER — Other Ambulatory Visit: Payer: Self-pay | Admitting: Rheumatology

## 2016-10-25 ENCOUNTER — Other Ambulatory Visit: Payer: Self-pay | Admitting: *Deleted

## 2016-10-25 ENCOUNTER — Telehealth: Payer: Self-pay | Admitting: Rheumatology

## 2016-10-25 MED ORDER — PREGABALIN 150 MG PO CAPS: 150.0000 mg | ORAL_CAPSULE | Freq: Three times a day (TID) | ORAL | 0 refills | Status: DC

## 2016-10-25 NOTE — Telephone Encounter (Signed)
Patient called stating that [facility] has not received her prescription for Lyrica.  She is requesting that you send it to [facility] [address]  CB#[phone].  Thank you.

## 2016-10-25 NOTE — Progress Notes (Signed)
Prescription had to be reorder to be re faxed to the pharmacy. Pharmacy did not receive prescription

## 2016-10-25 NOTE — Telephone Encounter (Signed)
Prescription sent to the pharmacy and patient advised.  

## 2016-12-03 ENCOUNTER — Telehealth (INDEPENDENT_AMBULATORY_CARE_PROVIDER_SITE_OTHER): Payer: Self-pay | Admitting: Orthopaedic Surgery

## 2017-02-07 DIAGNOSIS — Z8669 Personal history of other diseases of the nervous system and sense organs: Secondary | ICD-10-CM | POA: Insufficient documentation

## 2017-02-07 DIAGNOSIS — M5134 Other intervertebral disc degeneration, thoracic region: Secondary | ICD-10-CM | POA: Insufficient documentation

## 2017-02-07 NOTE — Progress Notes (Signed)
Office Visit Note  Patient: Cindy Nunez             Date of Birth: Nov 22, 1965           MRN: [id number]             PCP: Myrlene Broker, MD Referring: Myrlene Broker, MD Visit Date: 02/11/2017 Occupation: @GUAROCC @    Subjective:  Right hip pain   History of Present Illness: Cindy Nunez is a 51 y.o. female with history of osteoarthritis, disc disease and fibromyalgia. She states she had pneumonia in March. She was treated with Levaquin. A few weeks later she had a sudden pop in her right ankle. She was seen by an orthopedic surgeon who diagnosed her with partial tear of her Achilles tendon. She has been wearing a boot since then. At her follow-up visit in June she was told is not completely healed. She's been having pain in her right hip for the last one week now. She denies any radiculopathy. She does have this disease of thoracic and lumbar spine which causes chronic pain.  Activities of Daily Living:  Patient reports morning stiffness for 1 hour.   Patient Reports nocturnal pain.  Difficulty dressing/grooming: Denies Difficulty climbing stairs: Reports Difficulty getting out of chair: Denies Difficulty using hands for taps, buttons, cutlery, and/or writing: Denies   Review of Systems  Constitutional: Positive for fatigue. Negative for night sweats, weight gain, weight loss and weakness.  HENT: Positive for mouth dryness. Negative for mouth sores, trouble swallowing, trouble swallowing and nose dryness.   Eyes: Positive for dryness. Negative for pain, redness and visual disturbance.  Respiratory: Negative for cough, shortness of breath and difficulty breathing.   Cardiovascular: Negative for chest pain, palpitations, hypertension, irregular heartbeat and swelling in legs/feet.  Gastrointestinal: Positive for constipation. Negative for blood in stool and diarrhea.  Endocrine: Negative for increased urination.  Genitourinary: Negative for vaginal dryness.    Musculoskeletal: Positive for arthralgias, joint pain, myalgias and myalgias. Negative for joint swelling, muscle weakness, morning stiffness and muscle tenderness.  Skin: Negative for color change, rash, hair loss, skin tightness, ulcers and sensitivity to sunlight.  Allergic/Immunologic: Negative for susceptible to infections.  Neurological: Negative for dizziness, memory loss and night sweats.  Hematological: Negative for swollen glands.  Psychiatric/Behavioral: Positive for depressed mood and sleep disturbance. The patient is not nervous/anxious.     PMFS History:  Patient Active Problem List   Diagnosis Date Noted  . DDD (degenerative disc disease), 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  . History of migraine 02/07/2017  . Osteopenia  08/12/2016  . Obsessive-compulsive disorder 08/12/2016  . Anxiety 08/12/2016  . History of leukemia 08/08/2016  . Fibromyalgia 08/08/2016  . Chronic pain syndrome 08/08/2016  . 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  . History of vertebral fracture 08/08/2016  . Vitamin D deficiency 08/08/2016  . Other fatigue 08/08/2016  . Primary insomnia 08/08/2016  . 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 Arthritis   . Asthma   . Endometriosis 1989  . Hernia, umbilical 9794  . Thyroid disease     Family History  Problem Relation Age of Onset  . Cancer Father        Pancreatic-small cell carcinoma  . Cancer Paternal Aunt   . Cancer Paternal Uncle   .  Cancer Paternal Grandmother   . Cancer Paternal Grandfather   . Osteoporosis Mother   . Fibromyalgia Mother   . Osteoarthritis Mother    Past Surgical History:  Procedure Laterality Date  . ABDOMINAL HYSTERECTOMY  2001   Partial  . ANTERIOR AND POSTERIOR VAGINAL REPAIR  05/2010  . APPENDECTOMY   1990  . BACK SURGERY  10/29/07   L4, L5  . BREAST LUMPECTOMY  2006   Left breast; benign  . CESAREAN SECTION  1993  . CHOLECYSTECTOMY  1990  . HERNIA REPAIR  2011   Follow up  . HERNIA REPAIR  03/2009   Triple Hernia & Endomnioplasty (reported per pt)  . ROTATOR CUFF REPAIR  07/2011  . ROUX-EN-Y GASTRIC BYPASS  2007  . TONSILLECTOMY AND ADENOIDECTOMY  1979  . TYMPANOSTOMY [place], 1978  . UMBILICAL HERNIA REPAIR  2001   Social History   Social History Narrative  . No narrative on file     Objective: Vital Signs: BP 134/78   Pulse 78   Resp 16   Wt 227 lb (103 kg)   BMI 35.55 kg/m    Physical Exam  Constitutional: She is oriented to person, place, and time. She appears well-developed and well-nourished.  HENT:  Head: Normocephalic and atraumatic.  Eyes: Conjunctivae and EOM are normal.  Neck: Normal range of motion.  Cardiovascular: Normal rate, regular rhythm, normal heart sounds and intact distal pulses.   Pulmonary/Chest: Effort normal and breath sounds normal.  Abdominal: Soft. Bowel sounds are normal.  Lymphadenopathy:    She has no cervical adenopathy.  Neurological: She is alert and oriented to person, place, and time.  Skin: Skin is warm and dry. Capillary refill takes less than 2 seconds.  Psychiatric: She has a normal mood and affect. Her behavior is normal.  Nursing note and vitals reviewed.    Musculoskeletal Exam: C-spine and thoracic spine limited range of motion with discomfort. Shoulder joints elbow joints wrist joints with good range of motion. She has some DIP PIP thickening in her hands. No synovitis was noted. Hip joints knee joints ankles MTPs PIPs DIPs with good range of motion with no synovitis. She has tenderness on palpation over bilateral trochanteric bursa more so on the right side.  CDAI Exam: No CDAI exam completed.    Investigation: No additional findings.   Imaging: No results found.  Speciality Comments: No  specialty comments available.    Procedures:  Large Joint Inj Date/Time: 02/11/2017 3:05 PM Performed by: Bo Merino Authorized by: Bo Merino   Consent Given by:  Patient Site marked: the procedure site was marked   Timeout: prior to procedure the correct patient, procedure, and site was verified   Indications:  Pain Location:  Hip Site:  R greater trochanter Prep: patient was prepped and draped in usual sterile fashion   Needle Size:  27 G Needle Length:  1.5 inches Approach:  Lateral Ultrasound Guidance: No   Fluoroscopic Guidance: No   Arthrogram: No   Medications:  40 mg triamcinolone acetonide 40 MG/ML; 1.5 mL lidocaine 1 % Aspiration Attempted: No   Aspirate amount (mL):  0 Patient tolerance:  Patient tolerated the procedure well with no immediate complications   Allergies: Asa [aspirin]; Codeine; Penicillins; Relpax [eletriptan]; Toradol [ketorolac tromethamine]; and Milk-related compounds   Assessment / Plan:     Visit Diagnoses: Fibromyalgia: She continues to have generalized pain and discomfort and positive tender points.  Other fatigue: It is related to insomnia  Primary insomnia: Her insomnia is better with the medications.  Trochanteric bursitis of both hips: She had severe pain and discomfort in the right trochanteric area. Per her request after informed consent was opted right trochanteric area was prepped in sterile fashion and injected with cortisone and lidocaine as described above. ITB and exercise were demonstrated and discussed.  Right Achilles rupture: She is wearing a boot.  DDD (degenerative disc disease), thoracic  DDD (degenerative disc disease), lumbar s/ p Fusion : Chronic pain  Chronic pain: She is on multiple medications including Cymbalta Lyrica and muscle relaxers  Osteopenia  - T score -1.1 03/08/2016, right femur, improved by 3% compared to 2015  History of vertebral fracture - T10  Vitamin D deficiency  History of  gastric bypass  History of leukemia - acute promyelocytic leukemia f/u [facility]  History of obsessive compulsive disorder  History of migraine    Orders: Orders Placed This Encounter  Procedures  . Large Joint Injection/Arthrocentesis   Meds ordered this encounter  Medications  . cyclobenzaprine (FLEXERIL) 10 MG tablet    Sig: Take 1 tablet (10 mg total) by mouth at bedtime.    Dispense:  30 tablet    Refill:  6  . pregabalin (LYRICA) 150 MG capsule    Sig: Take 1 capsule (150 mg total) by mouth 3 (three) times daily.    Dispense:  270 capsule    Refill:  0  . methocarbamol (ROBAXIN) 750 MG tablet    Sig: Take 1 tablet (750 mg total) by mouth 2 (two) times daily.    Dispense:  180 tablet    Refill:  1    Face-to-face time spent with patient was 30 minutes. 50% of time was spent in counseling and coordination of care.  Follow-Up Instructions: Return in about 6 months (around 08/14/2017) for FMS DDD.   Bo Merino, MD  Note - This record has been created using Editor, commissioning.  Chart creation errors have been sought, but may not always  have been located. Such creation errors do not reflect on  the standard of medical care.

## 2017-02-11 ENCOUNTER — Ambulatory Visit (INDEPENDENT_AMBULATORY_CARE_PROVIDER_SITE_OTHER): Payer: 59 | Admitting: Rheumatology

## 2017-02-11 ENCOUNTER — Encounter: Payer: Self-pay | Admitting: Rheumatology

## 2017-02-11 VITALS — BP 134/78 | HR 78 | Resp 16 | Wt 227.0 lb

## 2017-02-11 DIAGNOSIS — M7062 Trochanteric bursitis, left hip: Secondary | ICD-10-CM

## 2017-02-11 DIAGNOSIS — Z8659 Personal history of other mental and behavioral disorders: Secondary | ICD-10-CM

## 2017-02-11 DIAGNOSIS — Z8669 Personal history of other diseases of the nervous system and sense organs: Secondary | ICD-10-CM

## 2017-02-11 DIAGNOSIS — Z9884 Bariatric surgery status: Secondary | ICD-10-CM | POA: Diagnosis not present

## 2017-02-11 DIAGNOSIS — F5101 Primary insomnia: Secondary | ICD-10-CM | POA: Diagnosis not present

## 2017-02-11 DIAGNOSIS — E559 Vitamin D deficiency, unspecified: Secondary | ICD-10-CM | POA: Diagnosis not present

## 2017-02-11 DIAGNOSIS — G894 Chronic pain syndrome: Secondary | ICD-10-CM

## 2017-02-11 DIAGNOSIS — M797 Fibromyalgia: Secondary | ICD-10-CM

## 2017-02-11 DIAGNOSIS — M85862 Other specified disorders of bone density and structure, left lower leg: Secondary | ICD-10-CM | POA: Diagnosis not present

## 2017-02-11 DIAGNOSIS — Z8781 Personal history of (healed) traumatic fracture: Secondary | ICD-10-CM

## 2017-02-11 DIAGNOSIS — R5383 Other fatigue: Secondary | ICD-10-CM | POA: Diagnosis not present

## 2017-02-11 DIAGNOSIS — M5134 Other intervertebral disc degeneration, thoracic region: Secondary | ICD-10-CM

## 2017-02-11 DIAGNOSIS — Z856 Personal history of leukemia: Secondary | ICD-10-CM

## 2017-02-11 DIAGNOSIS — M7061 Trochanteric bursitis, right hip: Secondary | ICD-10-CM | POA: Diagnosis not present

## 2017-02-11 DIAGNOSIS — M5136 Other intervertebral disc degeneration, lumbar region: Secondary | ICD-10-CM | POA: Diagnosis not present

## 2017-02-11 MED ORDER — CYCLOBENZAPRINE HCL 10 MG PO TABS: 10.0000 mg | ORAL_TABLET | Freq: Every day | ORAL | 6 refills | Status: DC

## 2017-02-11 MED ORDER — LIDOCAINE HCL 1 % IJ SOLN
1.5000 mL | INTRAMUSCULAR | Status: AC | PRN
  Administered 2017-02-11: 1.5 mL

## 2017-02-11 MED ORDER — TRIAMCINOLONE ACETONIDE 40 MG/ML IJ SUSP
40.0000 mg | INTRAMUSCULAR | Status: AC | PRN
  Administered 2017-02-11: 40 mg via INTRA_ARTICULAR

## 2017-02-11 MED ORDER — METHOCARBAMOL 750 MG PO TABS: 750.0000 mg | ORAL_TABLET | Freq: Two times a day (BID) | ORAL | 1 refills | Status: DC

## 2017-02-11 MED ORDER — PREGABALIN 150 MG PO CAPS: 150.0000 mg | ORAL_CAPSULE | Freq: Three times a day (TID) | ORAL | 0 refills | Status: DC

## 2017-02-28 ENCOUNTER — Other Ambulatory Visit: Payer: Self-pay | Admitting: Rheumatology

## 2017-04-25 ENCOUNTER — Other Ambulatory Visit: Payer: Self-pay | Admitting: Rheumatology

## 2017-04-25 NOTE — Telephone Encounter (Signed)
02/11/17 last visit 08/14/17 next visit Ok to refill Lyrica ? 150 tid

## 2017-04-29 ENCOUNTER — Telehealth: Payer: Self-pay

## 2017-04-29 NOTE — Telephone Encounter (Signed)
A prior authorization for Lyrica has been submitted to pt insurance via cover my meds. Will update once we receive a response.   Elloise Roark, [place], CPhT 9:32 AM

## 2017-05-05 NOTE — Telephone Encounter (Signed)
Received a fax from [employer] regarding a prior authorization [place] for [place]. Medication is only approved if patient is diagnosed with seizure disorder or neuropathic pain associated with spinal cord injury or have a history of failure, contraindication or intolerance to savella, effexor or a tricyclic antidepressant.    Reference number: [id number] Phone number:[phone]  Will send document to scan center.  Called patient to update. Did not get an answer. Left message.  Kramer Hanrahan, [place], CPhT 3:43 PM

## 2017-05-09 ENCOUNTER — Telehealth: Payer: Self-pay | Admitting: Rheumatology

## 2017-05-09 NOTE — Telephone Encounter (Signed)
Returned call. Explained the denial to pt. She states that she has been on this medication for years. She does not qualify for patient assistance and can not use co-pay assistance card. She is under her husbands insurance currently but that will change in February 2019. He will retire and they will be covered under medicare. She is requesting a call back with an alternative for Lyrica until her plan changes. Thanks!   Shakiara Lukic, [place], CPhT 4:33 PM

## 2017-05-09 NOTE — Telephone Encounter (Signed)
Per patient she has received a denial letter from insurance denying  rx for Lyrica. Patient concerned. Please call patient to discuss.

## 2017-05-12 MED ORDER — GABAPENTIN 300 MG PO CAPS: 300.0000 mg | ORAL_CAPSULE | Freq: Three times a day (TID) | ORAL | 2 refills | Status: DC

## 2017-05-12 NOTE — Telephone Encounter (Signed)
Spoke with patient and she is agreeable to go on the Gabapentin. Prescription sent to the pharmacy .

## 2017-05-12 NOTE — Telephone Encounter (Signed)
She may be able to use gabapentin 300 mg by mouth 3 times a day in case caloric is not covered.

## 2017-07-21 ENCOUNTER — Institutional Professional Consult (permissible substitution): Payer: 59 | Admitting: Emergency Medicine

## 2017-07-31 NOTE — Progress Notes (Deleted)
Office Visit Note  Patient: Cindy Nunez             Date of Birth: 02-Mar-1966           MRN: [id number]             PCP: Myrlene Broker, MD Referring: Myrlene Broker, MD Visit Date: 08/14/2017 Occupation: @GUAROCC @    Subjective:  No chief complaint on file.   History of Present Illness: Cindy Nunez is a 51 y.o. female ***   Activities of Daily Living:  Patient reports morning stiffness for *** {minute/hour:19697}.   Patient {ACTIONS;DENIES/REPORTS:21021675::"Denies"} nocturnal pain.  Difficulty dressing/grooming: {ACTIONS;DENIES/REPORTS:21021675::"Denies"} Difficulty climbing stairs: {ACTIONS;DENIES/REPORTS:21021675::"Denies"} Difficulty getting out of chair: {ACTIONS;DENIES/REPORTS:21021675::"Denies"} Difficulty using hands for taps, buttons, cutlery, and/or writing: {ACTIONS;DENIES/REPORTS:21021675::"Denies"}   No Rheumatology ROS completed.   PMFS History:  Patient Active Problem List   Diagnosis Date Noted  . DDD (degenerative disc disease), 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  . History of migraine 02/07/2017  . Osteopenia  08/12/2016  . Obsessive-compulsive disorder 08/12/2016  . Anxiety 08/12/2016  . History of leukemia 08/08/2016  . Fibromyalgia 08/08/2016  . Chronic pain syndrome 08/08/2016  . 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  . History of vertebral fracture 08/08/2016  . Vitamin D deficiency 08/08/2016  . Other fatigue 08/08/2016  . Primary insomnia 08/08/2016  . 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 Arthritis   . Asthma   . Endometriosis 1989  . Hernia, umbilical 7616  . Thyroid disease     Family History  Problem Relation Age of Onset  . Cancer Father        Pancreatic-small cell carcinoma  . Cancer Paternal Aunt   . Cancer  Paternal Uncle   . Cancer Paternal Grandmother   . Cancer Paternal Grandfather   . Osteoporosis Mother   . Fibromyalgia Mother   . Osteoarthritis Mother    Past Surgical History:  Procedure Laterality Date  . ABDOMINAL HYSTERECTOMY  2001   Partial  . ANTERIOR AND POSTERIOR VAGINAL REPAIR  05/2010  . APPENDECTOMY  1990  . BACK SURGERY  10/29/07   L4, L5  . BREAST LUMPECTOMY  2006   Left breast; benign  . CESAREAN SECTION  1993  . CHOLECYSTECTOMY  1990  . HERNIA REPAIR  2011   Follow up  . HERNIA REPAIR  03/2009   Triple Hernia & Endomnioplasty (reported per pt)  . ROTATOR CUFF REPAIR  07/2011  . ROUX-EN-Y GASTRIC BYPASS  2007  . TONSILLECTOMY AND ADENOIDECTOMY  1979  . TYMPANOSTOMY [place], 1978  . UMBILICAL HERNIA REPAIR  2001   Social History   Social History Narrative  . Not on file     Objective: Vital Signs: There were no vitals taken for this visit.   Physical Exam   Musculoskeletal Exam: ***  CDAI Exam: No CDAI exam completed.    Investigation: No additional findings.   Imaging: No results found.  Speciality Comments: No specialty comments available.    Procedures:  No procedures performed Allergies: Asa [aspirin]; Codeine; Penicillins; Relpax [eletriptan]; Toradol [ketorolac tromethamine]; and Milk-related compounds   Assessment / Plan:     Visit Diagnoses: No diagnosis found.    Orders: No orders of the defined types were placed in this encounter.  No orders  of the defined types were placed in this encounter.   Face-to-face time spent with patient was *** minutes. 50% of time was spent in counseling and coordination of care.  Follow-Up Instructions: No Follow-up on file.   Earnestine Mealing, CMA  Note - This record has been created using Editor, commissioning.  Chart creation errors have been sought, but may not always  have been located. Such creation errors do not reflect on  the standard of medical care.

## 2017-08-14 ENCOUNTER — Ambulatory Visit: Payer: 59 | Admitting: Rheumatology

## 2017-08-26 NOTE — Progress Notes (Deleted)
Office Visit Note  Patient: Cindy Nunez             Date of Birth: Mar 02, 1966           MRN: [id number]             PCP: Myrlene Broker, MD Referring: Myrlene Broker, MD Visit Date: 08/27/2017 Occupation: @GUAROCC @    Subjective:  No chief complaint on file.   History of Present Illness: Cindy Nunez is a 52 y.o. female ***   Activities of Daily Living:  Patient reports morning stiffness for *** {minute/hour:19697}.   Patient {ACTIONS;DENIES/REPORTS:21021675::"Denies"} nocturnal pain.  Difficulty dressing/grooming: {ACTIONS;DENIES/REPORTS:21021675::"Denies"} Difficulty climbing stairs: {ACTIONS;DENIES/REPORTS:21021675::"Denies"} Difficulty getting out of chair: {ACTIONS;DENIES/REPORTS:21021675::"Denies"} Difficulty using hands for taps, buttons, cutlery, and/or writing: {ACTIONS;DENIES/REPORTS:21021675::"Denies"}   No Rheumatology ROS completed.   PMFS History:  Patient Active Problem List   Diagnosis Date Noted  . DDD (degenerative disc disease), 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  . History of migraine 02/07/2017  . Osteopenia  08/12/2016  . Obsessive-compulsive disorder 08/12/2016  . Anxiety 08/12/2016  . History of leukemia 08/08/2016  . Fibromyalgia 08/08/2016  . Chronic pain syndrome 08/08/2016  . 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  . History of vertebral fracture 08/08/2016  . Vitamin D deficiency 08/08/2016  . Other fatigue 08/08/2016  . Primary insomnia 08/08/2016  . 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 Arthritis   . Asthma   . Endometriosis 1989  . Hernia, umbilical 4098  . Thyroid disease     Family History  Problem Relation Age of Onset  . Cancer Father        Pancreatic-small cell carcinoma  . Cancer Paternal Aunt   . Cancer  Paternal Uncle   . Cancer Paternal Grandmother   . Cancer Paternal Grandfather   . Osteoporosis Mother   . Fibromyalgia Mother   . Osteoarthritis Mother    Past Surgical History:  Procedure Laterality Date  . ABDOMINAL HYSTERECTOMY  2001   Partial  . ANTERIOR AND POSTERIOR VAGINAL REPAIR  05/2010  . APPENDECTOMY  1990  . BACK SURGERY  10/29/07   L4, L5  . BREAST LUMPECTOMY  2006   Left breast; benign  . CESAREAN SECTION  1993  . CHOLECYSTECTOMY  1990  . HERNIA REPAIR  2011   Follow up  . HERNIA REPAIR  03/2009   Triple Hernia & Endomnioplasty (reported per pt)  . ROTATOR CUFF REPAIR  07/2011  . ROUX-EN-Y GASTRIC BYPASS  2007  . TONSILLECTOMY AND ADENOIDECTOMY  1979  . TYMPANOSTOMY [place], 1978  . UMBILICAL HERNIA REPAIR  2001   Social History   Social History Narrative  . Not on file     Objective: Vital Signs: There were no vitals taken for this visit.   Physical Exam   Musculoskeletal Exam: ***  CDAI Exam: No CDAI exam completed.    Investigation: No additional findings.   Imaging: No results found.  Speciality Comments: No specialty comments available.    Procedures:  No procedures performed Allergies: Asa [aspirin]; Codeine; Penicillins; Relpax [eletriptan]; Toradol [ketorolac tromethamine]; and Milk-related compounds   Assessment / Plan:     Visit Diagnoses: No diagnosis found.    Orders: No orders of the defined types were placed in this encounter.  No orders  of the defined types were placed in this encounter.   Face-to-face time spent with patient was *** minutes. 50% of time was spent in counseling and coordination of care.  Follow-Up Instructions: No Follow-up on file.   Earnestine Mealing, CMA  Note - This record has been created using Editor, commissioning.  Chart creation errors have been sought, but may not always  have been located. Such creation errors do not reflect on  the standard of medical care.

## 2017-08-27 ENCOUNTER — Ambulatory Visit: Payer: 59 | Admitting: Rheumatology

## 2017-09-04 NOTE — Progress Notes (Signed)
Office Visit Note  Patient: Cindy Nunez             Date of Birth: 08-24-1965           MRN: [id number]             PCP: Myrlene Broker, MD Referring: Myrlene Broker, MD Visit Date: 09/11/2017 Occupation: @GUAROCC @    Subjective:  Generalized pain    History of Present Illness: Cindy Nunez is a 52 y.o. female with a history of fibromyalgia, trochanteric bursitis, and DDD.  Patient states that she is having bilateral trochanteric bursitis.  She has been performing her exercises at home.  She also has trapezius muscle tension and muscle tenderness.  She states that her knees and feet have been stable and not worsening.  She states that she has been working out on the elliptical 10 minutes in the morning, 10 minutes in the afternoon, and 10 minutes in the evening, which has been improving her muscle strength and fatigue.  She needs refills of her medications.   Activities of Daily Living:  Patient reports morning stiffness for 1.5 hour.   Patient Denies nocturnal pain.  Difficulty dressing/grooming: Denies Difficulty climbing stairs: Denies Difficulty getting out of chair: Denies Difficulty using hands for taps, buttons, cutlery, and/or writing: Reports   Review of Systems  Constitutional: Positive for fatigue. Negative for weakness.  HENT: Positive for mouth dryness. Negative for mouth sores and nose dryness.   Eyes: Positive for dryness. Negative for pain, redness and visual disturbance.  Respiratory: Negative for cough, hemoptysis, shortness of breath and difficulty breathing.   Cardiovascular: Negative for chest pain, palpitations, hypertension, irregular heartbeat and swelling in legs/feet.  Gastrointestinal: Negative for blood in stool, constipation and diarrhea.  Endocrine: Negative for increased urination.  Genitourinary: Negative for painful urination.  Musculoskeletal: Positive for arthralgias, joint pain, joint swelling, myalgias, morning stiffness, muscle  tenderness and myalgias. Negative for muscle weakness.  Skin: Positive for rash. Negative for color change, pallor, hair loss, nodules/bumps, redness, skin tightness, ulcers and sensitivity to sunlight.  Neurological: Positive for headaches (Migraines). Negative for dizziness and numbness.  Hematological: Negative for swollen glands.  Psychiatric/Behavioral: Positive for depressed mood and sleep disturbance. The patient is not nervous/anxious.     PMFS History:  Patient Active Problem List   Diagnosis Date Noted  . DDD (degenerative disc disease), 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  . History of migraine 02/07/2017  . Osteopenia  08/12/2016  . Obsessive-compulsive disorder 08/12/2016  . Anxiety 08/12/2016  . History of leukemia 08/08/2016  . Fibromyalgia 08/08/2016  . Chronic pain syndrome 08/08/2016  . 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  . History of vertebral fracture 08/08/2016  . Vitamin D deficiency 08/08/2016  . Other fatigue 08/08/2016  . Primary insomnia 08/08/2016  . 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 Arthritis   . Asthma   . Endometriosis 1989  . Hernia, umbilical 7782  . Thyroid disease     Family History  Problem Relation Age of Onset  . Cancer Father        Pancreatic-small cell carcinoma  . Cancer Paternal Aunt   . Cancer Paternal Uncle   . Cancer Paternal Grandmother   . Cancer Paternal Grandfather   . Osteoporosis Mother   . Fibromyalgia Mother   .  Osteoarthritis Mother    Past Surgical History:  Procedure Laterality Date  . ABDOMINAL HYSTERECTOMY  2001   Partial  . ANTERIOR AND POSTERIOR VAGINAL REPAIR  05/2010  . APPENDECTOMY  1990  . BACK SURGERY  10/29/07   L4, L5  . BREAST LUMPECTOMY  2006   Left breast; benign  . CESAREAN SECTION   1993  . CHOLECYSTECTOMY  1990  . HERNIA REPAIR  2011   Follow up  . HERNIA REPAIR  03/2009   Triple Hernia & Endomnioplasty (reported per pt)  . ROTATOR CUFF REPAIR  07/2011  . ROUX-EN-Y GASTRIC BYPASS  2007  . TONSILLECTOMY AND ADENOIDECTOMY  1979  . TYMPANOSTOMY [place], 1978  . UMBILICAL HERNIA REPAIR  2001   Social History   Social History Narrative  . Not on file     Objective: Vital Signs: BP (!) 133/92 (BP Location: Left Arm, Patient Position: Sitting, Cuff Size: Large)   Pulse (!) 104   Resp 14   Wt 240 lb (108.9 kg)   BMI 37.59 kg/m    Physical Exam  Constitutional: She is oriented to person, place, and time. She appears well-developed and well-nourished.  HENT:  Head: Normocephalic and atraumatic.  Eyes: Conjunctivae and EOM are normal.  Neck: Normal range of motion.  Cardiovascular: Normal rate, regular rhythm, normal heart sounds and intact distal pulses.  Pulmonary/Chest: Effort normal and breath sounds normal.  Abdominal: Soft. Bowel sounds are normal.  Lymphadenopathy:    She has no cervical adenopathy.  Neurological: She is alert and oriented to person, place, and time.  Skin: Skin is warm and dry. Capillary refill takes less than 2 seconds.  Psychiatric: She has a normal mood and affect. Her behavior is normal.  Nursing note and vitals reviewed.    Musculoskeletal Exam: C-spine, thoracic, and lumbar spine good. Shoulder joints, elbow joints, wrist joints, MCPs, PIPs, and DIPs good ROM with no synovitis.  Tenderness in trapezius region and lateral epicondyles.  Hip joints, knee joints, ankle joints, MTPs, PIPs, and DIPs good ROM with no synovitis.  Almost all tender points are tender.    CDAI Exam: No CDAI exam completed.    Investigation: No additional findings.   Imaging: No results found.  Speciality Comments: No specialty comments available.    Procedures:  No procedures performed Allergies: Asa [aspirin]; Codeine;  Penicillins; Relpax [eletriptan]; Toradol [ketorolac tromethamine]; and Milk-related compounds   Assessment / Plan:     Visit Diagnoses: Fibromyalgia: She has tender points on exam.  She has trapezius muscle tenderness and tension.  She also has bilateral trochanteric bursitis.  She exercises on a daily basis.  She will continue on her current treatment regimen of Lyrica, Cymbalta, Robaxin, and Flexeril.  She was given refills of all of these medications.  She will return in 6 months.   Primary insomnia: She continues to have insomnia and fatigue.  She take Flexeril at bedtime, which helps her sleep.    Other fatigue: Chronic and related to fatigue.  She is going to continue on her current exercise regimen which has helped with her energy level.   Trochanteric bursitis of both hips: Bilateral trochanteric bursitis.  She performs exercises at home.   DDD (degenerative disc disease), lumbar - s/ p Fusion: Chronic pain and stiffness.   DDD (degenerative disc disease), thoracic: Chronic pain   Chronic pain syndrome - She is on Cymbalta, Lyrica, Robaxin, and Flexeril, which manage her pain.  She will continue on  these medications.  She was given refills.   Osteopenia of multiple sites - T score -1.1 03/08/2016, right femur, improved by 3% compared to 2015  History of vertebral fracture - T10  Other medical conditions are listed as follows:   History of migraine  History of vitamin D deficiency  History of obsessive compulsive disorder  History of gastric bypass  History of leukemia - acute promyelocytic leukemia f/u at [facility] hospital  History of anxiety    Orders: No orders of the defined types were placed in this encounter.  Meds ordered this encounter  Medications  . cyclobenzaprine (FLEXERIL) 10 MG tablet    Sig: Take 1 tablet (10 mg total) by mouth at bedtime.    Dispense:  30 tablet    Refill:  6  . DULoxetine (CYMBALTA) 60 MG capsule    Sig: Take 1 capsule (60 mg  total) by mouth every morning.    Dispense:  30 capsule    Refill:  2  . methocarbamol (ROBAXIN) 750 MG tablet    Sig: Take 1 tablet (750 mg total) by mouth 2 (two) times daily.    Dispense:  180 tablet    Refill:  1  . pregabalin (LYRICA) 150 MG capsule    Sig: TAKE 1 CAPSULE BY MOUTH THREE(3) TIMES DAILY    Dispense:  270 capsule    Refill:  1    Face-to-face time spent with patient was 30 minutes. Greater than 50% of time was spent in counseling and coordination of care.  Follow-Up Instructions: Return in about 6 months (around 03/11/2018) for Fibromyalgia.   Bo Merino, MD  Note - This record has been created using Editor, commissioning.  Chart creation errors have been sought, but may not always  have been located. Such creation errors do not reflect on  the standard of medical care.

## 2017-09-11 ENCOUNTER — Encounter: Payer: Self-pay | Admitting: Rheumatology

## 2017-09-11 ENCOUNTER — Ambulatory Visit (INDEPENDENT_AMBULATORY_CARE_PROVIDER_SITE_OTHER): Payer: 59 | Admitting: Rheumatology

## 2017-09-11 VITALS — BP 133/92 | HR 104 | Resp 14 | Wt 240.0 lb

## 2017-09-11 DIAGNOSIS — Z9884 Bariatric surgery status: Secondary | ICD-10-CM

## 2017-09-11 DIAGNOSIS — R5383 Other fatigue: Secondary | ICD-10-CM

## 2017-09-11 DIAGNOSIS — Z8639 Personal history of other endocrine, nutritional and metabolic disease: Secondary | ICD-10-CM

## 2017-09-11 DIAGNOSIS — F5101 Primary insomnia: Secondary | ICD-10-CM

## 2017-09-11 DIAGNOSIS — M51369 Other intervertebral disc degeneration, lumbar region without mention of lumbar back pain or lower extremity pain: Secondary | ICD-10-CM

## 2017-09-11 DIAGNOSIS — Z8659 Personal history of other mental and behavioral disorders: Secondary | ICD-10-CM

## 2017-09-11 DIAGNOSIS — M797 Fibromyalgia: Secondary | ICD-10-CM | POA: Diagnosis not present

## 2017-09-11 DIAGNOSIS — G894 Chronic pain syndrome: Secondary | ICD-10-CM | POA: Diagnosis not present

## 2017-09-11 DIAGNOSIS — Z8781 Personal history of (healed) traumatic fracture: Secondary | ICD-10-CM | POA: Diagnosis not present

## 2017-09-11 DIAGNOSIS — M8589 Other specified disorders of bone density and structure, multiple sites: Secondary | ICD-10-CM

## 2017-09-11 DIAGNOSIS — Z8669 Personal history of other diseases of the nervous system and sense organs: Secondary | ICD-10-CM

## 2017-09-11 DIAGNOSIS — M5136 Other intervertebral disc degeneration, lumbar region: Secondary | ICD-10-CM

## 2017-09-11 DIAGNOSIS — M7062 Trochanteric bursitis, left hip: Secondary | ICD-10-CM

## 2017-09-11 DIAGNOSIS — M7061 Trochanteric bursitis, right hip: Secondary | ICD-10-CM | POA: Diagnosis not present

## 2017-09-11 DIAGNOSIS — M5134 Other intervertebral disc degeneration, thoracic region: Secondary | ICD-10-CM | POA: Diagnosis not present

## 2017-09-11 DIAGNOSIS — Z856 Personal history of leukemia: Secondary | ICD-10-CM

## 2017-09-11 MED ORDER — PREGABALIN 150 MG PO CAPS: ORAL_CAPSULE | ORAL | 1 refills | Status: DC

## 2017-09-11 MED ORDER — METHOCARBAMOL 750 MG PO TABS: 750.0000 mg | ORAL_TABLET | Freq: Two times a day (BID) | ORAL | 1 refills | Status: DC

## 2017-09-11 MED ORDER — CYCLOBENZAPRINE HCL 10 MG PO TABS: 10.0000 mg | ORAL_TABLET | Freq: Every day | ORAL | 6 refills | Status: DC

## 2017-09-11 MED ORDER — DULOXETINE HCL 60 MG PO CPEP: 60.0000 mg | ORAL_CAPSULE | ORAL | 2 refills | Status: DC

## 2017-09-25 ENCOUNTER — Telehealth: Payer: Self-pay | Admitting: Rheumatology

## 2017-09-25 NOTE — Telephone Encounter (Signed)
Patient calling in reference to Lyrica rx. Rx was covered thru insurance before, patient paid less than $50. Before, now [phone]. Please call patient to discuss med switch.

## 2017-09-26 NOTE — Telephone Encounter (Signed)
Patient advised that Lyrica has a copay card. Walked patient through how to get copay card. Patient verbalized understanding .

## 2017-09-29 NOTE — Telephone Encounter (Signed)
Patient left a voicemail stating that she was unable to open the link that you sent.  She states that her husband tried it on his phone too and also was unable to open it.

## 2017-11-18 ENCOUNTER — Telehealth: Payer: Self-pay | Admitting: Rheumatology

## 2017-11-18 NOTE — Telephone Encounter (Signed)
Patient left a voicemail returning your call.  °

## 2017-11-19 MED ORDER — GABAPENTIN 300 MG PO CAPS: 300.0000 mg | ORAL_CAPSULE | Freq: Three times a day (TID) | ORAL | 3 refills | Status: DC

## 2017-11-19 NOTE — Telephone Encounter (Signed)
Patient states her husband just retired and she is going to be without insurance for about a month. Patient states her Lyrica is going to cost her $1500 for that month. Patient would like to know if she can be switch to Gabapentin for that month. Please advise.

## 2017-11-19 NOTE — Telephone Encounter (Signed)
Prescription sent to the pharmacy and patient advised.  

## 2017-11-19 NOTE — Telephone Encounter (Signed)
Ok to do gabapentin 300 mg p.o. 3 times daily total 90-day supply with 3 refills

## 2017-12-17 ENCOUNTER — Telehealth: Payer: Self-pay | Admitting: Rheumatology

## 2017-12-17 NOTE — Telephone Encounter (Signed)
Patient called stating she had to switch from Lyrica to Gabapentin due to her insurance.  Patient states that she would like to switch back to her Lyrica and changed her pharmacy to [facility] on [address] in [place].

## 2017-12-17 NOTE — Telephone Encounter (Signed)
Please give her the dose as prescribed earlier.  Okay to switch back to Lyrica.

## 2017-12-18 MED ORDER — PREGABALIN 150 MG PO CAPS: ORAL_CAPSULE | ORAL | 1 refills | Status: DC

## 2018-02-16 ENCOUNTER — Other Ambulatory Visit: Payer: Self-pay | Admitting: Rheumatology

## 2018-03-04 NOTE — Progress Notes (Deleted)
Office Visit Note  Patient: Cindy Nunez             Date of Birth: 1965/11/28           MRN: [id number]             PCP: Myrlene Broker, MD Referring: Myrlene Broker, MD Visit Date: 03/17/2018 Occupation: '@GUAROCC' @  Subjective:  No chief complaint on file.   History of Present Illness: Cindy Nunez is a 52 y.o. female ***   Activities of Daily Living:  Patient reports morning stiffness for *** {minute/hour:19697}.   Patient {ACTIONS;DENIES/REPORTS:21021675::"Denies"} nocturnal pain.  Difficulty dressing/grooming: {ACTIONS;DENIES/REPORTS:21021675::"Denies"} Difficulty climbing stairs: {ACTIONS;DENIES/REPORTS:21021675::"Denies"} Difficulty getting out of chair: {ACTIONS;DENIES/REPORTS:21021675::"Denies"} Difficulty using hands for taps, buttons, cutlery, and/or writing: {ACTIONS;DENIES/REPORTS:21021675::"Denies"}  No Rheumatology ROS completed.   PMFS History:  Patient Active Problem List   Diagnosis Date Noted  . DDD (degenerative disc disease), 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  . History of migraine 02/07/2017  . Osteopenia  08/12/2016  . Obsessive-compulsive disorder 08/12/2016  . Anxiety 08/12/2016  . History of leukemia 08/08/2016  . Fibromyalgia 08/08/2016  . Chronic pain syndrome 08/08/2016  . 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  . History of vertebral fracture 08/08/2016  . Vitamin D deficiency 08/08/2016  . Other fatigue 08/08/2016  . Primary insomnia 08/08/2016  . 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 Arthritis   . Asthma   . Endometriosis 1989  . Hernia, umbilical 6387  . Thyroid disease     Family History  Problem Relation Age of Onset  . Cancer Father        Pancreatic-small cell carcinoma  . Cancer Paternal Aunt   . Cancer  Paternal Uncle   . Cancer Paternal Grandmother   . Cancer Paternal Grandfather   . Osteoporosis Mother   . Fibromyalgia Mother   . Osteoarthritis Mother    Past Surgical History:  Procedure Laterality Date  . ABDOMINAL HYSTERECTOMY  2001   Partial  . ANTERIOR AND POSTERIOR VAGINAL REPAIR  05/2010  . APPENDECTOMY  1990  . BACK SURGERY  10/29/07   L4, L5  . BREAST LUMPECTOMY  2006   Left breast; benign  . CESAREAN SECTION  1993  . CHOLECYSTECTOMY  1990  . HERNIA REPAIR  2011   Follow up  . HERNIA REPAIR  03/2009   Triple Hernia & Endomnioplasty (reported per pt)  . ROTATOR CUFF REPAIR  07/2011  . ROUX-EN-Y GASTRIC BYPASS  2007  . TONSILLECTOMY AND ADENOIDECTOMY  1979  . TYMPANOSTOMY [place], 1978  . UMBILICAL HERNIA REPAIR  2001   Social History   Social History Narrative  . Not on file    Objective: Vital Signs: There were no vitals taken for this visit.   Physical Exam   Musculoskeletal Exam: ***  CDAI Exam: No CDAI exam completed.   Investigation: No additional findings.  Imaging: No results found.  Recent Labs: Lab Results  Component Value Date   WBC (LL) 11/01/2010    0.6 REPEATED TO VERIFY CRITICAL RESULT CALLED TO, READ BACK BY AND VERIFIED WITH: BRYANT,T. RN AT 0805 ON 11/01/10 BY GILLESPIE,B. RARE NRBCs SEE BONE MARROW [id number]   HGB 10.7 (L) 11/01/2010   PLT (LL) 11/01/2010    22 REPEATED TO VERIFY PLATELET COUNT  CONFIRMED BY SMEAR CRITICAL RESULT CALLED TO, READ BACK BY AND VERIFIED WITH: BRYANT,T. RN AT 0805 ON 11/01/10 BY GILLESPIE,B. SPECIMEN CHECKED FOR CLOTS   NA 142 10/29/2010   K 3.4 (L) 10/29/2010   CL 110 10/29/2010   CO2 19 10/29/2010   GLUCOSE 132 (H) 10/29/2010   BUN 24 (H) 10/29/2010   CREATININE 0.93 10/29/2010   BILITOT 0.3 10/29/2010   ALKPHOS 98 10/29/2010   AST 15 10/29/2010   ALT 17 10/29/2010   PROT 6.2 10/29/2010   ALBUMIN 3.9 10/29/2010   CALCIUM 8.8 10/29/2010   GFRAA  11/09/2007    >60          The eGFR has been calculated using the MDRD equation. This calculation has not been validated in all clinical    Speciality Comments: No specialty comments available.  Procedures:  No procedures performed Allergies: Asa [aspirin]; Codeine; Penicillins; Relpax [eletriptan]; Toradol [ketorolac tromethamine]; and Milk-related compounds   Assessment / Plan:     Visit Diagnoses: No diagnosis found.   Orders: No orders of the defined types were placed in this encounter.  No orders of the defined types were placed in this encounter.   Face-to-face time spent with patient was *** minutes. Greater than 50% of time was spent in counseling and coordination of care.  Follow-Up Instructions: No follow-ups on file.   Earnestine Mealing, CMA  Note - This record has been created using Editor, commissioning.  Chart creation errors have been sought, but may not always  have been located. Such creation errors do not reflect on  the standard of medical care.

## 2018-03-17 ENCOUNTER — Ambulatory Visit: Payer: 59 | Admitting: Rheumatology

## 2018-03-23 ENCOUNTER — Telehealth: Payer: Self-pay

## 2018-03-23 MED ORDER — METHOCARBAMOL 750 MG PO TABS: 750.0000 mg | ORAL_TABLET | Freq: Two times a day (BID) | ORAL | 0 refills | Status: DC

## 2018-03-23 NOTE — Telephone Encounter (Signed)
Refill request received via fax.   Last visit: 09/11/2017 Next visit: message sent to the front desk to schedule patient.   Okay to refill per Dr. Estanislado Pandy.

## 2018-03-24 NOTE — Progress Notes (Signed)
Office Visit Note  Patient: Cindy Nunez             Date of Birth: 1966-06-04           MRN: [id number]             PCP: Myrlene Broker, MD Referring: Myrlene Broker, MD Visit Date: 03/25/2018 Occupation: '@GUAROCC'$ @  Subjective:  Trochanteric bursitis bilaterally   History of Present Illness: Cindy Nunez is a 52 y.o. female with history of fibromyalgia, trochanteric bursitis bilaterally, and DDD.  She is taking Lyrica 150 mg 3 times daily and Cymbalta 60 mg by mouth daily.  She takes Robaxin 750 mg twice daily for muscle spasms and Flexeril 10 mg at bedtime for muscle spasms.  She states that she just returned from a three-week vacation which has improved some of her discomfort and fatigue.  She states that she also had a breast reduction which has helped with the muscle tension muscle tenderness in her back.  She states she continues to have trochanteric bursitis bilaterally.  She is also having pain in both knee joints.  She denies any joint swelling.  She states that she has not had any migraines recently.   Activities of Daily Living:  Patient reports morning stiffness for 6 hours.   Patient Reports nocturnal pain.  Difficulty dressing/grooming: Denies Difficulty climbing stairs: Reports Difficulty getting out of chair: Denies Difficulty using hands for taps, buttons, cutlery, and/or writing: Reports  Review of Systems  Constitutional: Positive for fatigue.  HENT: Positive for mouth dryness. Negative for mouth sores and nose dryness.   Eyes: Positive for dryness. Negative for pain and visual disturbance.  Respiratory: Negative for cough, hemoptysis, shortness of breath and difficulty breathing.   Cardiovascular: Positive for swelling in legs/feet. Negative for chest pain, palpitations and hypertension.  Gastrointestinal: Positive for constipation and diarrhea. Negative for blood in stool.  Endocrine: Negative for increased urination.  Genitourinary: Negative for painful  urination.  Musculoskeletal: Positive for arthralgias, joint pain, joint swelling, muscle weakness, morning stiffness and muscle tenderness. Negative for myalgias and myalgias.  Skin: Negative for color change, pallor, rash, hair loss, nodules/bumps, skin tightness, ulcers and sensitivity to sunlight.  Allergic/Immunologic: Negative for susceptible to infections.  Neurological: Negative for dizziness, numbness and headaches.  Hematological: Negative for swollen glands.  Psychiatric/Behavioral: Positive for sleep disturbance. Negative for depressed mood. The patient is not nervous/anxious.     PMFS History:  Patient Active Problem List   Diagnosis Date Noted  . DDD (degenerative disc disease), 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  . History of migraine 02/07/2017  . Osteopenia  08/12/2016  . Obsessive-compulsive disorder 08/12/2016  . Anxiety 08/12/2016  . History of leukemia 08/08/2016  . Fibromyalgia 08/08/2016  . Chronic pain syndrome 08/08/2016  . 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  . History of vertebral fracture 08/08/2016  . Vitamin D deficiency 08/08/2016  . Other fatigue 08/08/2016  . Primary insomnia 08/08/2016  . 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 Arthritis   . Asthma   . Endometriosis 1989  . Hernia, umbilical 6004  . Thyroid disease     Family History  Problem Relation Age of Onset  . Cancer Father        Pancreatic-small cell carcinoma  . Cancer Paternal Aunt   .  Cancer Paternal Uncle   . Cancer Paternal Grandmother   . Cancer Paternal Grandfather   . Osteoporosis Mother   . Fibromyalgia Mother   . Osteoarthritis Mother    Past Surgical History:  Procedure Laterality Date  . ABDOMINAL HYSTERECTOMY  2001   Partial  . ANTERIOR AND POSTERIOR  VAGINAL REPAIR  05/2010  . APPENDECTOMY  1990  . BACK SURGERY  10/29/07   L4, L5  . BREAST LUMPECTOMY  2006   Left breast; benign  . CESAREAN SECTION  1993  . CHOLECYSTECTOMY  1990  . HERNIA REPAIR  2011   Follow up  . HERNIA REPAIR  03/2009   Triple Hernia & Endomnioplasty (reported per pt)  . ROTATOR CUFF REPAIR  07/2011  . ROUX-EN-Y GASTRIC BYPASS  2007  . TONSILLECTOMY AND ADENOIDECTOMY  1979  . TOTAL SHOULDER ARTHROPLASTY    . TYMPANOSTOMY [place], 1978  . UMBILICAL HERNIA REPAIR  2001   Social History   Social History Narrative  . Not on file    Objective: Vital Signs: BP 118/80 (BP Location: Right Arm, Patient Position: Bed low/side rails up, Cuff Size: Normal)   Pulse 93   Resp 16   Ht '5\' 6"'$  (1.676 m)   Wt 235 lb 9.6 oz (106.9 kg)   BMI 38.03 kg/m    Physical Exam  Constitutional: She is oriented to person, place, and time. She appears well-developed and well-nourished.  HENT:  Head: Normocephalic and atraumatic.  Eyes: Conjunctivae and EOM are normal.  Neck: Normal range of motion.  Cardiovascular: Normal rate, regular rhythm, normal heart sounds and intact distal pulses.  Pulmonary/Chest: Effort normal and breath sounds normal.  Abdominal: Soft. Bowel sounds are normal.  Lymphadenopathy:    She has no cervical adenopathy.  Neurological: She is alert and oriented to person, place, and time.  Skin: Skin is warm and dry. Capillary refill takes less than 2 seconds.  Psychiatric: She has a normal mood and affect. Her behavior is normal.  Nursing note and vitals reviewed.    Musculoskeletal Exam: C-spine, thoracic spine, and lumbar spine good ROM.  No midline spinal tenderness.  No SI joint tenderness.  Shoulder joints, elbow joints, wrist joints, MCPs, PIPs, and DIPs good ROM with no synovitis.  Complete fist formation bilaterally.  Mild DIP synovial thickening consistent with osteoarthritis.  Hip joints, knee joints, ankle joints, MTPs, PIPs,  and DIPs good ROM with no synovitis. Bunion of both 1st MTPs. No warmth or effusion of knee joints.  Tenderness of both trochanteric bursa.   CDAI Exam: CDAI Score: Not documented Patient Global Assessment: Not documented; Provider Global Assessment: Not documented Swollen: Not documented; Tender: Not documented Joint Exam   Not documented   There is currently no information documented on the homunculus. Go to the Rheumatology activity and complete the homunculus joint exam.  Investigation: No additional findings.  Imaging: No results found.  Recent Labs: Lab Results  Component Value Date   WBC (LL) 11/01/2010    0.6 REPEATED TO VERIFY CRITICAL RESULT CALLED TO, READ BACK BY AND VERIFIED WITH: BRYANT,T. RN AT 0805 ON 11/01/10 BY GILLESPIE,B. RARE NRBCs SEE BONE MARROW [id number]   HGB 10.7 (L) 11/01/2010   PLT (LL) 11/01/2010    22 REPEATED TO VERIFY PLATELET COUNT CONFIRMED BY SMEAR CRITICAL RESULT CALLED TO, READ BACK BY AND VERIFIED WITH: BRYANT,T. RN AT 0805 ON 11/01/10 BY GILLESPIE,B. SPECIMEN CHECKED FOR CLOTS   NA 142 10/29/2010   K  3.4 (L) 10/29/2010   CL 110 10/29/2010   CO2 19 10/29/2010   GLUCOSE 132 (H) 10/29/2010   BUN 24 (H) 10/29/2010   CREATININE 0.93 10/29/2010   BILITOT 0.3 10/29/2010   ALKPHOS 98 10/29/2010   AST 15 10/29/2010   ALT 17 10/29/2010   PROT 6.2 10/29/2010   ALBUMIN 3.9 10/29/2010   CALCIUM 8.8 10/29/2010   GFRAA  11/09/2007    >60        The eGFR has been calculated using the MDRD equation. This calculation has not been validated in all clinical    Speciality Comments: No specialty comments available.  Procedures:  Large Joint Inj: bilateral greater trochanter on 03/25/2018 10:32 AM Indications: pain Details: 27 G 1.5 in needle, lateral approach  Arthrogram: No  Medications (Right): 1.5 mL lidocaine 1 %; 40 mg triamcinolone acetonide 40 MG/ML Aspirate (Right): 0 mL Medications (Left): 1.5 mL lidocaine 1 %; 40 mg triamcinolone  acetonide 40 MG/ML Aspirate (Left): 0 mL Outcome: tolerated well, no immediate complications Procedure, treatment alternatives, risks and benefits explained, specific risks discussed. Consent was given by the patient. Immediately prior to procedure a time out was called to verify the correct patient, procedure, equipment, support staff and site/side marked as required. Patient was prepped and draped in the usual sterile fashion.     Allergies: Asa [aspirin]; Codeine; Penicillins; Relpax [eletriptan]; Toradol [ketorolac tromethamine]; and Milk-related compounds   Assessment / Plan:     Visit Diagnoses: Fibromyalgia - She has not had any recent fibromyalgia flares.  She has been on vacation for the past 3 weeks in [place]. She recently had a breast reduction, which has helped with the muscle tension and tenderness in her back.  She continues to have trochanteric bursitis bilaterally.  She requested bilateral cortisone injections today.  She tolerated the procedure well.  She is on Lyrica 150 mg 3 times daily and Cymbalta 60 mg by mouth daily.  She takes Robaxin 750 mg twice daily as needed for muscle spasms and Flexeril 10 mg at bedtime.  Refills of Flexeril, Cymbalta, and Lyrica were sent to the pharmacy today. She was encouraged to continue to stay active and perform light exercise on a regular basis.   Primary insomnia: Improved.  She takes Flexeril 10 mg po at bedtime.   Other fatigue: Improved.  She was very active on vacation.  She was encouraged to stay active and perform light exercise on a regular basis.   Trochanteric bursitis of both hips: She has tenderness of bilateral trochanteric bursa.  She requested bilateral cortisone injections today in the office.  Potential side effects were discussed.  She tolerated the procedure well.  She was encouraged to perform stretching exercises regularly.  DDD (degenerative disc disease), thoracic: No midline spinal tenderness.  Her back pain has  improved since having a breast reduction.   DDD (degenerative disc disease), lumbar - S/p fusion: She has no midline spinal tenderness on exam.   Chronic pain syndrome: Her pain has been better controlled.   Osteopenia of multiple sites: She takes a vitamin D supplement.   History of vertebral fracture: No midline spinal tenderness.   History of migraine: She has not had any recent migraines.   History of vitamin D deficiency: She is on a vitamin D supplement.   Other medical conditions are listed as follows:   History of obsessive compulsive disorder  History of gastric bypass  History of leukemia  History of anxiety   Orders: Orders Placed  This Encounter  Procedures  . Large Joint Inj   Meds ordered this encounter  Medications  . pregabalin (LYRICA) 150 MG capsule    Sig: TAKE 1 CAPSULE BY MOUTH THREE(3) TIMES DAILY    Dispense:  270 capsule    Refill:  1  . DULoxetine (CYMBALTA) 60 MG capsule    Sig: Take 1 capsule (60 mg total) by mouth every morning.    Dispense:  30 capsule    Refill:  2  . cyclobenzaprine (FLEXERIL) 10 MG tablet    Sig: Take 1 tablet (10 mg total) by mouth at bedtime.    Dispense:  30 tablet    Refill:  2      Follow-Up Instructions: Return in about 6 months (around 09/25/2018) for Fibromyalgia, DDD.   Ofilia Neas, PA-C  Note - This record has been created using Dragon software.  Chart creation errors have been sought, but may not always  have been located. Such creation errors do not reflect on  the standard of medical care.

## 2018-03-25 ENCOUNTER — Encounter: Payer: Self-pay | Admitting: Physician Assistant

## 2018-03-25 ENCOUNTER — Ambulatory Visit: Payer: Medicare HMO | Admitting: Physician Assistant

## 2018-03-25 VITALS — BP 118/80 | HR 93 | Resp 16 | Ht 66.0 in | Wt 235.6 lb

## 2018-03-25 DIAGNOSIS — M7062 Trochanteric bursitis, left hip: Secondary | ICD-10-CM | POA: Diagnosis not present

## 2018-03-25 DIAGNOSIS — M7061 Trochanteric bursitis, right hip: Secondary | ICD-10-CM

## 2018-03-25 DIAGNOSIS — Z8781 Personal history of (healed) traumatic fracture: Secondary | ICD-10-CM

## 2018-03-25 DIAGNOSIS — Z856 Personal history of leukemia: Secondary | ICD-10-CM

## 2018-03-25 DIAGNOSIS — Z8659 Personal history of other mental and behavioral disorders: Secondary | ICD-10-CM

## 2018-03-25 DIAGNOSIS — F5101 Primary insomnia: Secondary | ICD-10-CM | POA: Diagnosis not present

## 2018-03-25 DIAGNOSIS — Z8639 Personal history of other endocrine, nutritional and metabolic disease: Secondary | ICD-10-CM

## 2018-03-25 DIAGNOSIS — M5134 Other intervertebral disc degeneration, thoracic region: Secondary | ICD-10-CM

## 2018-03-25 DIAGNOSIS — Z8669 Personal history of other diseases of the nervous system and sense organs: Secondary | ICD-10-CM

## 2018-03-25 DIAGNOSIS — M797 Fibromyalgia: Secondary | ICD-10-CM | POA: Diagnosis not present

## 2018-03-25 DIAGNOSIS — R5383 Other fatigue: Secondary | ICD-10-CM

## 2018-03-25 DIAGNOSIS — M8589 Other specified disorders of bone density and structure, multiple sites: Secondary | ICD-10-CM

## 2018-03-25 DIAGNOSIS — G894 Chronic pain syndrome: Secondary | ICD-10-CM

## 2018-03-25 DIAGNOSIS — M5136 Other intervertebral disc degeneration, lumbar region: Secondary | ICD-10-CM

## 2018-03-25 DIAGNOSIS — Z9884 Bariatric surgery status: Secondary | ICD-10-CM

## 2018-03-25 MED ORDER — LIDOCAINE HCL 1 % IJ SOLN
1.5000 mL | INTRAMUSCULAR | Status: AC | PRN
  Administered 2018-03-25: 1.5 mL

## 2018-03-25 MED ORDER — CYCLOBENZAPRINE HCL 10 MG PO TABS: 10.0000 mg | ORAL_TABLET | Freq: Every day | ORAL | 2 refills | Status: DC

## 2018-03-25 MED ORDER — PREGABALIN 150 MG PO CAPS: ORAL_CAPSULE | ORAL | 1 refills | Status: DC

## 2018-03-25 MED ORDER — DULOXETINE HCL 60 MG PO CPEP: 60.0000 mg | ORAL_CAPSULE | ORAL | 2 refills | Status: DC

## 2018-03-25 MED ORDER — TRIAMCINOLONE ACETONIDE 40 MG/ML IJ SUSP
40.0000 mg | INTRAMUSCULAR | Status: AC | PRN
  Administered 2018-03-25: 40 mg via INTRA_ARTICULAR

## 2018-05-20 ENCOUNTER — Telehealth: Payer: Self-pay | Admitting: Rheumatology

## 2018-05-20 NOTE — Telephone Encounter (Signed)
Patient called stating she got up this morning and has pain in her ankles, knees, shoulders and back.  Patient states she is taking Lyrica but doesn't feel like it is giving her any relief.  Patient is asking if there is anything Dr. Estanislado Pandy could call into the pharmacy.

## 2018-05-20 NOTE — Progress Notes (Deleted)
Office Visit Note  Patient: Cindy Nunez             Date of Birth: [birth year]           MRN: [id number]             PCP: Myrlene Broker, MD Referring: Myrlene Broker, MD Visit Date: 05/21/2018 Occupation: '@GUAROCC' @  Subjective:  No chief complaint on file.   History of Present Illness: Cindy Nunez is a 52 y.o. female ***   Activities of Daily Living:  Patient reports morning stiffness for *** {minute/hour:19697}.   Patient {ACTIONS;DENIES/REPORTS:21021675::"Denies"} nocturnal pain.  Difficulty dressing/grooming: {ACTIONS;DENIES/REPORTS:21021675::"Denies"} Difficulty climbing stairs: {ACTIONS;DENIES/REPORTS:21021675::"Denies"} Difficulty getting out of chair: {ACTIONS;DENIES/REPORTS:21021675::"Denies"} Difficulty using hands for taps, buttons, cutlery, and/or writing: {ACTIONS;DENIES/REPORTS:21021675::"Denies"}  No Rheumatology ROS completed.   PMFS History:  Patient Active Problem List   Diagnosis Date Noted  . DDD (degenerative disc disease), 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  . History of migraine 02/07/2017  . Osteopenia  08/12/2016  . Obsessive-compulsive disorder 08/12/2016  . Anxiety 08/12/2016  . History of leukemia 08/08/2016  . Fibromyalgia 08/08/2016  . Chronic pain syndrome 08/08/2016  . 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  . History of vertebral fracture 08/08/2016  . Vitamin D deficiency 08/08/2016  . Other fatigue 08/08/2016  . Primary insomnia 08/08/2016  . 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 Arthritis   . Asthma   . Endometriosis 1989  . Hernia, umbilical 9122  . Thyroid disease     Family History  Problem Relation Age of Onset  . Cancer Father        Pancreatic-small cell carcinoma  . Cancer Paternal Aunt   . Cancer  Paternal Uncle   . Cancer Paternal Grandmother   . Cancer Paternal Grandfather   . Osteoporosis Mother   . Fibromyalgia Mother   . Osteoarthritis Mother    Past Surgical History:  Procedure Laterality Date  . ABDOMINAL HYSTERECTOMY  2001   Partial  . ANTERIOR AND POSTERIOR VAGINAL REPAIR  05/2010  . APPENDECTOMY  1990  . BACK SURGERY  10/29/07   L4, L5  . BREAST LUMPECTOMY  2006   Left breast; benign  . CESAREAN SECTION  1993  . CHOLECYSTECTOMY  1990  . HERNIA REPAIR  2011   Follow up  . HERNIA REPAIR  03/2009   Triple Hernia & Endomnioplasty (reported per pt)  . ROTATOR CUFF REPAIR  07/2011  . ROUX-EN-Y GASTRIC BYPASS  2007  . TONSILLECTOMY AND ADENOIDECTOMY  1979  . TOTAL SHOULDER ARTHROPLASTY    . TYMPANOSTOMY [place], 1978  . UMBILICAL HERNIA REPAIR  2001   Social History   Social History Narrative  . Not on file    Objective: Vital Signs: There were no vitals taken for this visit.   Physical Exam   Musculoskeletal Exam: ***  CDAI Exam: CDAI Score: Not documented Patient Global Assessment: Not documented; Provider Global Assessment: Not documented Swollen: Not documented; Tender: Not documented Joint Exam   Not documented   There is currently no information documented on the homunculus. Go to the Rheumatology activity and complete the homunculus joint exam.  Investigation: No additional findings.  Imaging: No results found.  Recent Labs: Lab Results  Component Value Date   WBC (LL) 11/01/2010  0.6 REPEATED TO VERIFY CRITICAL RESULT CALLED TO, READ BACK BY AND VERIFIED WITH: BRYANT,T. RN AT 0805 ON 11/01/10 BY GILLESPIE,B. RARE NRBCs SEE BONE MARROW [id number]   HGB 10.7 (L) 11/01/2010   PLT (LL) 11/01/2010    22 REPEATED TO VERIFY PLATELET COUNT CONFIRMED BY SMEAR CRITICAL RESULT CALLED TO, READ BACK BY AND VERIFIED WITH: BRYANT,T. RN AT 0805 ON 11/01/10 BY GILLESPIE,B. SPECIMEN CHECKED FOR CLOTS   NA 142 10/29/2010   K 3.4 (L)  10/29/2010   CL 110 10/29/2010   CO2 19 10/29/2010   GLUCOSE 132 (H) 10/29/2010   BUN 24 (H) 10/29/2010   CREATININE 0.93 10/29/2010   BILITOT 0.3 10/29/2010   ALKPHOS 98 10/29/2010   AST 15 10/29/2010   ALT 17 10/29/2010   PROT 6.2 10/29/2010   ALBUMIN 3.9 10/29/2010   CALCIUM 8.8 10/29/2010   GFRAA  11/09/2007    >60        The eGFR has been calculated using the MDRD equation. This calculation has not been validated in all clinical    Speciality Comments: No specialty comments available.  Procedures:  No procedures performed Allergies: Asa [aspirin]; Codeine; Penicillins; Relpax [eletriptan]; Toradol [ketorolac tromethamine]; and Milk-related compounds   Assessment / Plan:     Visit Diagnoses: No diagnosis found.   Orders: No orders of the defined types were placed in this encounter.  No orders of the defined types were placed in this encounter.   Face-to-face time spent with patient was *** minutes. Greater than 50% of time was spent in counseling and coordination of care.  Follow-Up Instructions: No follow-ups on file.   Earnestine Mealing, CMA  Note - This record has been created using Editor, commissioning.  Chart creation errors have been sought, but may not always  have been located. Such creation errors do not reflect on  the standard of medical care.

## 2018-05-20 NOTE — Telephone Encounter (Signed)
Attempted to contact the patient and left message for patient to call the office.  

## 2018-05-20 NOTE — Telephone Encounter (Signed)
Patient states when she woke up this morning she had a migraine headache. Patient states she is having pain in her shoulders, elbows, knees, ankles, and back. Patient states she was hurting so bad she was in tears. Patient states she has taken medication- Relpax for her migraine and has been able to rest. Patient states she does feel some better after resting. Patient states she takes Cymbalta, Lyrica, Flexeril, and Robaxin. Patient states she is unsure if she may be having a Fibromyalgia flare up. Patient denies any activity out of the ordinary. Patient has been scheduled for 05/21/18 at 3:00 pm.

## 2018-05-21 ENCOUNTER — Ambulatory Visit: Payer: Self-pay | Admitting: Physician Assistant

## 2018-06-06 ENCOUNTER — Other Ambulatory Visit: Payer: Self-pay | Admitting: Rheumatology

## 2018-06-08 NOTE — Telephone Encounter (Signed)
LMOM for patient to call and schedule follow-up appointment.   °

## 2018-06-08 NOTE — Telephone Encounter (Signed)
Please schedule patient for a follow up visit. Patient due February 2020. Thanks!  

## 2018-06-08 NOTE — Telephone Encounter (Signed)
Last visit:03/25/18 Next Visit due February 2020. Message sent to the front to schedule   Okay to refill per Dr. Estanislado Pandy

## 2018-08-10 ENCOUNTER — Other Ambulatory Visit: Payer: Self-pay | Admitting: Physician Assistant

## 2018-08-10 NOTE — Telephone Encounter (Signed)
Last visit:03/25/18 Next Visit: 09/24/18  Okay to refill per Dr. Estanislado Pandy

## 2018-09-04 ENCOUNTER — Other Ambulatory Visit: Payer: Self-pay | Admitting: Rheumatology

## 2018-09-04 NOTE — Telephone Encounter (Signed)
Last visit:03/25/18 Next Visit: 09/24/18  Okay to refill per Dr. Estanislado Pandy

## 2018-09-10 NOTE — Progress Notes (Signed)
Office Visit Note  Patient: Cindy Nunez             Date of Birth: 02-Oct-1965           MRN: [id number]             PCP: Myrlene Broker, MD Referring: Myrlene Broker, MD Visit Date: 09/24/2018 Occupation: '@GUAROCC' @  Subjective:  Medication monitoring   History of Present Illness: Cindy Nunez is a 53 y.o. female with history of fibromyalgia, DDD, and osteopenia.  She continues to take Cymbalta 60 mg 1 capsule by mouth daily.  She takes Robaxin 750 mg 1 tablet by mouth twice daily PRN for muscle spasms and Flexeril 10 mg 1 tablet by mouth at bedtime for muscle spasms.  She continues to take Lyrica 150 mg 1 capsule by mouth 3 times daily.  She feels as though this is been a good combination of medications for her.  She reports that she has not had any recent fibromyalgia flares.  She states that she continues to have trochanter bursitis bilaterally but has not been severe lately.  She states that she performs yoga exercises every morning which has been helping.  She states that she continues to have pain in bilateral hands and bilateral knee joints.  She states that she does not have any joint swelling.  She states that she does have some pain in bilateral elbow joints.  She states that she has been having less frequent migraines.  She states that her shoulder pain and neck pain has improved significantly.  She states that her level of fatigue has been stable.  She states that most nights she goes to sleep around 8:30 PM and has been waking up between 4-5 AM but feels rested.    Activities of Daily Living:  Patient reports morning stiffness for several hours.   Patient Denies nocturnal pain.  Difficulty dressing/grooming: Denies Difficulty climbing stairs: Denies Difficulty getting out of chair: Denies Difficulty using hands for taps, buttons, cutlery, and/or writing: Denies  Review of Systems  Constitutional: Positive for fatigue.  HENT: Positive for mouth dryness. Negative for  mouth sores and nose dryness.   Eyes: Positive for dryness. Negative for pain, itching and visual disturbance.  Respiratory: Negative for cough, hemoptysis, shortness of breath, wheezing and difficulty breathing.   Cardiovascular: Negative for chest pain, palpitations, hypertension and swelling in legs/feet.  Gastrointestinal: Negative for abdominal pain, blood in stool, constipation and diarrhea.  Endocrine: Negative for increased urination.  Genitourinary: Negative for painful urination, nocturia and pelvic pain.  Musculoskeletal: Positive for arthralgias, joint pain, joint swelling, morning stiffness and muscle tenderness. Negative for myalgias, muscle weakness and myalgias.  Skin: Positive for nodules/bumps. Negative for color change, pallor, rash, hair loss, redness, skin tightness, ulcers and sensitivity to sunlight.  Allergic/Immunologic: Negative for susceptible to infections.  Neurological: Negative for dizziness, light-headedness, numbness, headaches and memory loss.  Hematological: Negative for swollen glands.  Psychiatric/Behavioral: Positive for sleep disturbance. Negative for depressed mood and confusion. The patient is not nervous/anxious.     PMFS History:  Patient Active Problem List   Diagnosis Date Noted  . DDD (degenerative disc disease), 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  . History of migraine 02/07/2017  . Osteopenia  08/12/2016  . Obsessive-compulsive disorder 08/12/2016  . Anxiety 08/12/2016  . History of leukemia 08/08/2016  . Fibromyalgia 08/08/2016  . Chronic pain syndrome 08/08/2016  . 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  . History of vertebral fracture 08/08/2016  .  Vitamin D deficiency 08/08/2016  . Other fatigue 08/08/2016  . Primary insomnia 08/08/2016  . 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 Acute  promyelocytic leukemia 10/2010   Remission in 07/2011  . Arthritis   . Asthma   . Endometriosis 1989  . Hernia, umbilical 8099  . Thyroid disease     Family History  Problem Relation Age of Onset  . Cancer Father        Pancreatic-small cell carcinoma  . Cancer Paternal Aunt   . Cancer Paternal Uncle   . Cancer Paternal Grandmother   . Cancer Paternal Grandfather   . Osteoporosis Mother   . Fibromyalgia Mother   . Osteoarthritis Mother    Past Surgical History:  Procedure Laterality Date  . ABDOMINAL HYSTERECTOMY  2001   Partial  . ANTERIOR AND POSTERIOR VAGINAL REPAIR  05/2010  . APPENDECTOMY  1990  . BACK SURGERY  10/29/07   L4, L5  . BREAST LUMPECTOMY  2006   Left breast; benign  . CESAREAN SECTION  1993  . CHOLECYSTECTOMY  1990  . HERNIA REPAIR  2011   Follow up  . HERNIA REPAIR  03/2009   Triple Hernia & Endomnioplasty (reported per pt)  . ROTATOR CUFF REPAIR  07/2011  . ROUX-EN-Y GASTRIC BYPASS  2007  . TONSILLECTOMY AND ADENOIDECTOMY  1979  . TOTAL SHOULDER ARTHROPLASTY    . TYMPANOSTOMY [place], 1978  . UMBILICAL HERNIA REPAIR  2001   Social History   Social History Narrative  . Not on file    There is no immunization history on file for this patient.   Objective: Vital Signs: BP (!) 152/90 (BP Location: Left Arm, Patient Position: Sitting, Cuff Size: Normal)   Pulse 93   Resp 14   Ht '5\' 6"'  (1.676 m)   Wt 226 lb 9.6 oz (102.8 kg)   BMI 36.57 kg/m    Physical Exam Vitals signs and nursing note reviewed.  Constitutional:      Appearance: She is well-developed.  HENT:     Head: Normocephalic and atraumatic.  Eyes:     Conjunctiva/sclera: Conjunctivae normal.  Neck:     Musculoskeletal: Normal range of motion.  Cardiovascular:     Rate and Rhythm: Normal rate and regular rhythm.     Heart sounds: Normal heart sounds.  Pulmonary:     Effort: Pulmonary effort is normal.     Breath sounds: Normal breath sounds.  Abdominal:       General: Bowel sounds are normal.     Palpations: Abdomen is soft.  Lymphadenopathy:     Cervical: No cervical adenopathy.  Skin:    General: Skin is warm and dry.     Capillary Refill: Capillary refill takes less than 2 seconds.  Neurological:     Mental Status: She is alert and oriented to person, place, and time.  Psychiatric:        Behavior: Behavior normal.      Musculoskeletal Exam: C-spine, thoracic spine, lumbar spine good range of motion.  No midline spinal tenderness.  No SI joint tenderness.  Shoulder joints, with joints, wrist joints, MCPs, PIPs, DIPs good range of motion with no synovitis.  She has complete fist formation bilaterally.  She has mild DIP synovial thickening.  Hip joints, knee joints, ankle joints, MCPs, PIPs, DIPs good range of motion with  no synovitis.  No warmth or effusion bilateral knee joints.  No tenderness or swelling of ankle joints.  She has tenderness over bilateral trochanter bursa.  CDAI Exam: CDAI Score: Not documented Patient Global Assessment: Not documented; Provider Global Assessment: Not documented Swollen: Not documented; Tender: Not documented Joint Exam   Not documented   There is currently no information documented on the homunculus. Go to the Rheumatology activity and complete the homunculus joint exam.  Investigation: No additional findings.  Imaging: No results found.  Recent Labs: Lab Results  Component Value Date   WBC (LL) 11/01/2010    0.6 REPEATED TO VERIFY CRITICAL RESULT CALLED TO, READ BACK BY AND VERIFIED WITH: BRYANT,T. RN AT 0805 ON 11/01/10 BY GILLESPIE,B. RARE NRBCs SEE BONE MARROW [id number]   HGB 10.7 (L) 11/01/2010   PLT (LL) 11/01/2010    22 REPEATED TO VERIFY PLATELET COUNT CONFIRMED BY SMEAR CRITICAL RESULT CALLED TO, READ BACK BY AND VERIFIED WITH: BRYANT,T. RN AT 0805 ON 11/01/10 BY GILLESPIE,B. SPECIMEN CHECKED FOR CLOTS   NA 142 10/29/2010   K 3.4 (L) 10/29/2010   CL 110 10/29/2010   CO2 19  10/29/2010   GLUCOSE 132 (H) 10/29/2010   BUN 24 (H) 10/29/2010   CREATININE 0.93 10/29/2010   BILITOT 0.3 10/29/2010   ALKPHOS 98 10/29/2010   AST 15 10/29/2010   ALT 17 10/29/2010   PROT 6.2 10/29/2010   ALBUMIN 3.9 10/29/2010   CALCIUM 8.8 10/29/2010   GFRAA  11/09/2007    >60        The eGFR has been calculated using the MDRD equation. This calculation has not been validated in all clinical    Speciality Comments: No specialty comments available.  Procedures:  No procedures performed Allergies: Asa [aspirin]; Codeine; Penicillins; Relpax [eletriptan]; Toradol [ketorolac tromethamine]; and Milk-related compounds   Assessment / Plan:     Visit Diagnoses: Fibromyalgia -her fibromyalgia has been manageable.  She continues to have some generalized muscle aches and muscle tenderness.  She continues to have bilateral trochanter bursitis.  She has bilateral lateral epicondylitis.  She declined cortisone injection today.  She takes Robaxin 750 mg 1 tablet twice daily PRN for muscle spasms and Flexeril 10 mg by mouth at bedtime to help with muscle spasms.  She continues to take Cymbalta 60 mg 1 capsule by mouth daily and Lyrica 150 mg 3 times daily.  A refill of Robaxin was sent to the pharmacy today.  She has been having less frequent migraines.  The importance of good sleep hygiene and regular exercise was discussed.  She performs yoga on a daily basis.  She is encouraged to try to stay active.  She will follow-up in the office in 6 months.    Other fatigue: Her level of fatigue has been stable.  She is encouraged to stay active and exercise on a regular basis.  Primary insomnia: She has been sleeping well at night.  Good sleep hygiene was discussed.  Trochanteric bursitis of both hips: She has tenderness over bilateral trochanteric bursa.  She has been performing stretching exercises on a daily basis.  She has also been doing yoga daily.  She declined cortisone injections today.  She had  a good response to cortisone injections on 03/25/2018.  DDD (degenerative disc disease), lumbar - S/p fusion: She has intermittent lower back pain. She has not midline spinal tenderness on exam.   DDD (degenerative disc disease), thoracic: She has no midline spinal tenderness.   Other medical conditions  are listed as follows:   History of promyelocytic leukemia in remission  Chronic pain syndrome  Osteopenia of multiple sites  History of vertebral fracture  History of vitamin D deficiency  History of obsessive compulsive disorder  History of leukemia  History of anxiety  History of gastric bypass  History of migraine   Orders: No orders of the defined types were placed in this encounter.  Meds ordered this encounter  Medications  . methocarbamol (ROBAXIN) 750 MG tablet    Sig: TAKE 1 TABLET BY MOUTH TWICE(2) DAILY    Dispense:  180 tablet    Refill:  0      Follow-Up Instructions: Return in about 6 months (around 03/25/2019) for Fibromyalgia.   Ofilia Neas, PA-C I examined and evaluated the patient with Hazel Sams PA.  Patient has some discomfort from fibromyalgia underlying disc disease and osteoarthritis.  She had no synovitis on examination.  She will continue on the current regimen.  The plan of care was discussed as noted above.  Bo Merino, MD Note - This record has been created using Editor, commissioning.  Chart creation errors have been sought, but may not always  have been located. Such creation errors do not reflect on  the standard of medical care.

## 2018-09-14 ENCOUNTER — Telehealth: Payer: Self-pay | Admitting: Rheumatology

## 2018-09-14 NOTE — Telephone Encounter (Signed)
Patient called stating she received a letter from her insurance company stating in order for her Methocarbomal to be filled they need Dr. Estanislado Pandy to send documentation stating it is medically necessary for patient to receive the medication.  Phone [phone] and Fax [phone]

## 2018-09-14 NOTE — Telephone Encounter (Signed)
Spoke with patient and she will have her prescription transferred to [facility] and use good [facility] to get prescription

## 2018-09-24 ENCOUNTER — Encounter: Payer: Self-pay | Admitting: Rheumatology

## 2018-09-24 ENCOUNTER — Ambulatory Visit: Payer: Medicare Other | Admitting: Rheumatology

## 2018-09-24 VITALS — BP 152/90 | HR 93 | Resp 14 | Ht 66.0 in | Wt 226.6 lb

## 2018-09-24 DIAGNOSIS — F5101 Primary insomnia: Secondary | ICD-10-CM

## 2018-09-24 DIAGNOSIS — G894 Chronic pain syndrome: Secondary | ICD-10-CM

## 2018-09-24 DIAGNOSIS — Z8659 Personal history of other mental and behavioral disorders: Secondary | ICD-10-CM

## 2018-09-24 DIAGNOSIS — Z9884 Bariatric surgery status: Secondary | ICD-10-CM

## 2018-09-24 DIAGNOSIS — M7062 Trochanteric bursitis, left hip: Secondary | ICD-10-CM

## 2018-09-24 DIAGNOSIS — M8589 Other specified disorders of bone density and structure, multiple sites: Secondary | ICD-10-CM

## 2018-09-24 DIAGNOSIS — Z8669 Personal history of other diseases of the nervous system and sense organs: Secondary | ICD-10-CM

## 2018-09-24 DIAGNOSIS — M7061 Trochanteric bursitis, right hip: Secondary | ICD-10-CM | POA: Diagnosis not present

## 2018-09-24 DIAGNOSIS — M5136 Other intervertebral disc degeneration, lumbar region: Secondary | ICD-10-CM

## 2018-09-24 DIAGNOSIS — Z856 Personal history of leukemia: Secondary | ICD-10-CM

## 2018-09-24 DIAGNOSIS — Z8781 Personal history of (healed) traumatic fracture: Secondary | ICD-10-CM

## 2018-09-24 DIAGNOSIS — R5383 Other fatigue: Secondary | ICD-10-CM

## 2018-09-24 DIAGNOSIS — M5134 Other intervertebral disc degeneration, thoracic region: Secondary | ICD-10-CM

## 2018-09-24 DIAGNOSIS — Z8639 Personal history of other endocrine, nutritional and metabolic disease: Secondary | ICD-10-CM

## 2018-09-24 DIAGNOSIS — M797 Fibromyalgia: Secondary | ICD-10-CM | POA: Diagnosis not present

## 2018-09-24 MED ORDER — METHOCARBAMOL 750 MG PO TABS: ORAL_TABLET | ORAL | 0 refills | Status: DC

## 2018-10-05 ENCOUNTER — Telehealth: Payer: Self-pay | Admitting: Rheumatology

## 2018-10-05 MED ORDER — DULOXETINE HCL 60 MG PO CPEP: 60.0000 mg | ORAL_CAPSULE | ORAL | 2 refills | Status: DC

## 2018-10-05 MED ORDER — PREGABALIN 150 MG PO CAPS: ORAL_CAPSULE | ORAL | 1 refills | Status: DC

## 2018-10-05 NOTE — Telephone Encounter (Signed)
Patient called requesting prescription refills of Lyrica, Flexeril, and Cymbalta to be sent to her [place] on [facility]

## 2018-10-05 NOTE — Telephone Encounter (Signed)
Last Visit: 09/24/18 NExt visit: 03/04/19  Okay to refill Lyrica and Cymbalta?   Too soon for Flexeril refill

## 2018-11-09 ENCOUNTER — Encounter: Payer: Self-pay | Admitting: Rheumatology

## 2018-11-09 ENCOUNTER — Telehealth (INDEPENDENT_AMBULATORY_CARE_PROVIDER_SITE_OTHER): Payer: Medicare Other | Admitting: Rheumatology

## 2018-11-09 ENCOUNTER — Telehealth: Payer: Self-pay | Admitting: Rheumatology

## 2018-11-09 DIAGNOSIS — R5383 Other fatigue: Secondary | ICD-10-CM

## 2018-11-09 DIAGNOSIS — F5101 Primary insomnia: Secondary | ICD-10-CM

## 2018-11-09 DIAGNOSIS — Z8639 Personal history of other endocrine, nutritional and metabolic disease: Secondary | ICD-10-CM

## 2018-11-09 DIAGNOSIS — M5136 Other intervertebral disc degeneration, lumbar region: Secondary | ICD-10-CM

## 2018-11-09 DIAGNOSIS — M8589 Other specified disorders of bone density and structure, multiple sites: Secondary | ICD-10-CM

## 2018-11-09 DIAGNOSIS — M797 Fibromyalgia: Secondary | ICD-10-CM

## 2018-11-09 DIAGNOSIS — M7061 Trochanteric bursitis, right hip: Secondary | ICD-10-CM

## 2018-11-09 DIAGNOSIS — Z8669 Personal history of other diseases of the nervous system and sense organs: Secondary | ICD-10-CM

## 2018-11-09 DIAGNOSIS — Z8781 Personal history of (healed) traumatic fracture: Secondary | ICD-10-CM

## 2018-11-09 DIAGNOSIS — G894 Chronic pain syndrome: Secondary | ICD-10-CM

## 2018-11-09 DIAGNOSIS — M7062 Trochanteric bursitis, left hip: Secondary | ICD-10-CM

## 2018-11-09 DIAGNOSIS — Z856 Personal history of leukemia: Secondary | ICD-10-CM

## 2018-11-09 DIAGNOSIS — Z8659 Personal history of other mental and behavioral disorders: Secondary | ICD-10-CM

## 2018-11-09 DIAGNOSIS — Z9884 Bariatric surgery status: Secondary | ICD-10-CM

## 2018-11-09 DIAGNOSIS — M5134 Other intervertebral disc degeneration, thoracic region: Secondary | ICD-10-CM

## 2018-11-09 NOTE — Telephone Encounter (Signed)
-----   Message from [place] sent at 11/09/2018 10:55 AM EDT ----- Patient had virtual visit today with Dr. Estanislado Pandy, please call to schedule 6 month follow up. Thanks!

## 2018-11-09 NOTE — Progress Notes (Signed)
Virtual Visit via Video Note  I connected with Cindy Nunez on 11/09/18 at  9:15 AM EDT by a video enabled telemedicine application and verified that I am speaking with the correct person using two identifiers.   I discussed the limitations of evaluation and management by telemedicine and the availability of in person appointments. The patient expressed understanding and agreed to proceed.  CC: Left trochanteric bursitis  History of Present Illness: Patient is a 53 year old female with a past medial history of fibromyalgia.  She takes Lyrica 150 mg 1 capsule po TID and Cymbalta 60 mg 1 capsule by mouth daily.  She takes Robaxin 750 mg 1 tablet po BID and Flexeril 10 mg 1 tablet po at bedtime for muscle spasms.  She reports she is having left trochanteric bursitis since Thursday.  She has been having increased discomfort with walking but is fine if she is resting.  She thinks she may have slept on her left side Wednesday night which exacerbated the discomfort.  She continues to have generalized muscle aches and muscle tenderness.  She denies any joint pain or joint swelling at this time.  She has had a bone density in the past, ordered by PCP.      Review of Systems  Constitutional: Negative for fever and malaise/fatigue.  HENT:       Reports mouth dryness  Eyes: Negative for photophobia, pain and redness.       Reports eye dryness   Respiratory: Negative for cough, shortness of breath and wheezing.   Cardiovascular: Negative for chest pain and palpitations.  Gastrointestinal: Negative for constipation, diarrhea, nausea and vomiting.  Genitourinary: Negative for dysuria.  Musculoskeletal: Positive for myalgias.  Skin: Negative for rash.  Neurological: Negative for dizziness and headaches.  Psychiatric/Behavioral: Positive for depression. The patient is nervous/anxious.     Observations/Objective: Physical Exam  Constitutional: She is oriented to person, place, and time.  Neurological:  She is alert and oriented to person, place, and time.  Psychiatric: Memory, affect and judgment normal.  Morning stiffness lasting 2 hours. Reports difficulty climbing steps and getting up from a chair.    Assessment and Plan: Fibromyalgia: She continues to have generalized muscle aches and muscle tenderness.  She has left trochanteric bursitis.  We discussed exercises to perform and will mail a handout of these exercises to the patient.  She can also use voltaren gel topically and try changing positions frequently.  If her discomfort persists, she will schedule a trochanteric bursa cortisone injection.  Her level of fatigue has improved.  She continues to take Lyrica 150 mg TID and Cymbalta 60 mg po daily which helps with her level of pain.  She takes Robaxin 750 mg po BID PRN and Flexeril 10 mg po at bedtime for muscle spasms.  She was encouraged to stay active and exercise on a regular basis.  She will follow up in 6 months.  Primary insomnia: She takes Flexeril 10 mg po at bedtime.  Other fatigue: She has no fatigue at this time.    Trochanteric bursitis of both hips: She had bilateral trochanteric bursa cortisone injections on 03/25/18. She has been having worsening left trochanteric bursitis since Thursday.  She is having severe pain when walking and laying on her left side. We discussed changing positions frequently.  We also discussed using voltaren gel topically. We will mail IT band and trochanteric bursa exercises.  If her symptoms persist with can schedule a cortisone injection.   DDD, lumbar: She  has no discomfort at this time. DDD, thoracic: She has no discomfort at this time.  Chronic pain syndrome: She takes Lyrica 150 mg TID and Cymbalta 60 mg po daily.  She takes Mobic 15 mg 1 tablet po daily PRN and tylenol PRN for pain relief.  Osteopenia of multiple sites: DEXAs ordered by PCP.  We discussed resistive exercises.  She should be taking a calcium and vitamin D supplement daily.  We  recommend taking  Calcium 1200 mg daily.   Follow Up Instructions: She will follow up in 6 months. She will notify us if she would like to schedule a left trochanteric bursa cortisone injection.   We will mail IT band/trochanteric bursa exercises to the patient.    I discussed the assessment and treatment plan with the patient. The patient was provided an opportunity to ask questions and all were answered. The patient agreed with the plan and demonstrated an understanding of the instructions.   The patient was advised to call back or seek an in-person evaluation if the symptoms worsen or if the condition fails to improve as anticipated.  I provided 24 minutes of non-face-to-face time during this encounter.  Bo Merino, MD   Scribed by-   Ofilia Neas, PA-C

## 2018-11-09 NOTE — Telephone Encounter (Signed)
Patient advised that Dr.Deveshwar does not prescribe pain medication. Patient advised she should contact her PCP. Patient verbalized understanding.

## 2018-11-09 NOTE — Telephone Encounter (Signed)
LMOM for patient to call and schedule 6 month follow-up appointment ?

## 2018-11-09 NOTE — Telephone Encounter (Signed)
Patient left a voicemail stating her hip is extremely painful.  Patient states her sister gave her 600 mg motrin which she has been taking.  Patient is requesting a prescription for pain medication.  Patient requested a return call.

## 2018-11-09 NOTE — Patient Instructions (Signed)
Trochanteric Bursitis Rehab Ask your health care provider which exercises are safe for you. Do exercises exactly as told by your health care provider and adjust them as directed. It is normal to feel mild stretching, pulling, tightness, or discomfort as you do these exercises, but you should stop right away if you feel sudden pain or your pain gets worse.Do not begin these exercises until told by your health care provider. Stretching exercises These exercises warm up your muscles and joints and improve the movement and flexibility of your hip. These exercises also help to relieve pain and stiffness. Exercise A: Iliotibial band stretch  1. Lie on your side with your left / right leg in the top position. 2. Bend your left / right knee and grab your ankle. 3. Slowly bring your knee back so your thigh is behind your body. 4. Slowly lower your knee toward the floor until you feel a gentle stretch on the outside of your left / right thigh. If you do not feel a stretch and your knee will not fall farther, place the heel of your other foot on top of your outer knee and pull your thigh down farther. 5. Hold this position for __________ seconds. 6. Slowly return to the starting position. Repeat __________ times. Complete this exercise __________ times a day. Strengthening exercises These exercises build strength and endurance in your hip and pelvis. Endurance is the ability to use your muscles for a long time, even after they get tired. Exercise B: Bridge (hip extensors)  1. Lie on your back on a firm surface with your knees bent and your feet flat on the floor. 2. Tighten your buttocks muscles and lift your buttocks off the floor until your trunk is level with your thighs. You should feel the muscles working in your buttocks and the back of your thighs. If this exercise is too easy, try doing it with your arms crossed over your chest. 3. Hold this position for __________ seconds. 4. Slowly return to the  starting position. 5. Let your muscles relax completely between repetitions. Repeat __________ times. Complete this exercise __________ times a day. Exercise C: Squats (knee extensors and  quadriceps) 1. Stand in front of a table, with your feet and knees pointing straight ahead. You may rest your hands on the table for balance but not for support. 2. Slowly bend your knees and lower your hips like you are going to sit in a chair. ? Keep your weight over your heels, not over your toes. ? Keep your lower legs upright so they are parallel with the table legs. ? Do not let your hips go lower than your knees. ? Do not bend lower than told by your health care provider. ? If your hip pain increases, do not bend as low. 3. Hold this position for __________ seconds. 4. Slowly push with your legs to return to standing. Do not use your hands to pull yourself to standing. Repeat __________ times. Complete this exercise __________ times a day. Exercise D: Hip hike 1. Stand sideways on a bottom step. Stand on your left / right leg with your other foot unsupported next to the step. You can hold onto the railing or wall if needed for balance. 2. Keeping your knees straight and your torso square, lift your left / right hip up toward the ceiling. 3. Hold this position for __________ seconds. 4. Slowly let your left / right hip lower toward the floor, past the starting position. Your foot should   get closer to the floor. Do not lean or bend your knees. Repeat __________ times. Complete this exercise __________ times a day. Exercise E: Single leg stand 1. Stand near a counter or door frame that you can hold onto for balance as needed. It is helpful to stand in front of a mirror for this exercise so you can watch your hip. 2. Squeeze your left / right buttock muscles then lift up your other foot. Do not let your left / right hip push out to the side. 3. Hold this position for __________ seconds. Repeat __________  times. Complete this exercise __________ times a day. This information is not intended to replace advice given to you by your health care provider. Make sure you discuss any questions you have with your health care provider. Document Released: 09/05/2004 Document Revised: 04/04/2016 Document Reviewed: 07/14/2015 Elsevier Interactive Patient Education  2019 [employer] Iliotibial Band Syndrome Rehab Ask your health care provider which exercises are safe for you. Do exercises exactly as told by your health care provider and adjust them as directed. It is normal to feel mild stretching, pulling, tightness, or discomfort as you do these exercises, but you should stop right away if you feel sudden pain or your pain gets worse.Do not begin these exercises until told by your health care provider. Stretching and range of motion exercises These exercises warm up your muscles and joints and improve the movement and flexibility of your hip and pelvis. Exercise A: Quadriceps, prone  7. Lie on your abdomen on a firm surface, such as a bed or padded floor. 8. Bend your left / right knee and hold your ankle. If you cannot reach your ankle or pant leg, loop a belt around your foot and grab the belt instead. 9. Gently pull your heel toward your buttocks. Your knee should not slide out to the side. You should feel a stretch in the front of your thigh and knee. 10. Hold this position for __________ seconds. Repeat __________ times. Complete this stretch __________ times a day. Exercise B: Iliotibial band  6. Lie on your side with your left / right leg in the top position. 7. Bend both of your knees and grab your left / right ankle. Stretch out your bottom arm to help you balance. 8. Slowly bring your top knee back so your thigh goes behind your trunk. 9. Slowly lower your top leg toward the floor until you feel a gentle stretch on the outside of your left / right hip and thigh. If you do not feel a stretch and  your knee will not fall farther, place the heel of your other foot on top of your knee and pull your knee down toward the floor with your foot. 10. Hold this position for __________ seconds. Repeat __________ times. Complete this stretch __________ times a day. Strengthening exercises These exercises build strength and endurance in your hip and pelvis. Endurance is the ability to use your muscles for a long time, even after they get tired. Exercise C: Straight leg raises (hip abductors)  1. Lie on your side with your left / right leg in the top position. Lie so your head, shoulder, knee, and hip line up. You may bend your bottom knee to help you balance. 2. Roll your hips slightly forward so your hips are stacked directly over each other and your left / right knee is facing forward. 3. Tense the muscles in your outer thigh and lift your top leg 4-6 inches (  10-15 cm). 4. Hold this position for __________ seconds. 5. Slowly return to the starting position. Let your muscles relax completely before doing another repetition. Repeat __________ times. Complete this exercise __________ times a day. Exercise D: Straight leg raises (hip extensors) 5. Lie on your abdomen on your bed or a firm surface. You can put a pillow under your hips if that is more comfortable. 6. Bend your left / right knee so your foot is straight up in the air. 7. Squeeze your buttock muscles and lift your left / right thigh off the bed. Do not let your back arch. 8. Tense this muscle as hard as you can without increasing any knee pain. 9. Hold this position for __________ seconds. 10. Slowly lower your leg to the starting position and allow it to relax completely. Repeat __________ times. Complete this exercise __________ times a day. Exercise E: Hip hike 4. Stand sideways on a bottom step. Stand on your left / right leg with your other foot unsupported next to the step. You can hold onto the railing or wall if needed for  balance. 5. Keep your knees straight and your torso square. Then, lift your left / right hip up toward the ceiling. 6. Slowly let your left / right hip lower toward the floor, past the starting position. Your foot should get closer to the floor. Do not lean or bend your knees. Repeat __________ times. Complete this exercise __________ times a day. This information is not intended to replace advice given to you by your health care provider. Make sure you discuss any questions you have with your health care provider. Document Released: 07/29/2005 Document Revised: 04/02/2016 Document Reviewed: 06/30/2015 Elsevier Interactive Patient Education  2019 Elsevier Inc.  

## 2018-11-11 ENCOUNTER — Telehealth: Payer: Self-pay | Admitting: Neurology

## 2018-11-11 NOTE — Telephone Encounter (Signed)
Due to current COVID 19 pandemic, our office is severely reducing in office visits for at least the next 2 weeks, in order to minimize the risk to our patients and healthcare providers. Pt understands that although there may be some limitations with this type of visit, we will take all precautions to reduce any security or privacy concerns.  Pt understands that this will be treated like an in office visit and we will file with pt's insurance, and there may be a patient responsible charge related to this service. Pt's email is [email or web address]. Pt understands that the [employer] webex software must be downloaded and operational on the device pt plans to use for the visit.

## 2018-11-11 NOTE — Telephone Encounter (Signed)
Noted thank you

## 2018-11-16 ENCOUNTER — Other Ambulatory Visit: Payer: Self-pay

## 2018-11-16 ENCOUNTER — Encounter: Payer: Self-pay | Admitting: Neurology

## 2018-11-16 ENCOUNTER — Ambulatory Visit (INDEPENDENT_AMBULATORY_CARE_PROVIDER_SITE_OTHER): Payer: Medicare Other | Admitting: Neurology

## 2018-11-16 DIAGNOSIS — G43711 Chronic migraine without aura, intractable, with status migrainosus: Secondary | ICD-10-CM | POA: Diagnosis not present

## 2018-11-16 NOTE — Addendum Note (Signed)
Addended by: Gildardo Griffes on: 11/16/2018 02:22 PM   Modules accepted: Orders

## 2018-11-16 NOTE — Progress Notes (Signed)
[facility]    Provider:  Dr Jaynee Eagles Requesting Provider: Jillyn Hidden, MD Primary Care Provider:  Myrlene Broker, MD  CC:  Migraines  Virtual Visit via Video Note  I connected with@ on 11/16/18 at  2:30 PM EDT by a video enabled telemedicine application and verified that I am speaking with the correct person using two identifiers.Patient at home. Physician in the office.    I discussed the limitations of evaluation and management by telemedicine and the availability of in person appointments. The patient expressed understanding and agreed to proceed.  Cindy Beam, MD  HPI:  Cindy Nunez is a 53 y.o. female here as requested by Myrlene Broker, MD for migraines. She has a hx of migraines. 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 She has been on botox with great results. Prior was 20-25 migraine days a month. With botox she has decreased to 2 migraine days a month. Never had any side effects. She has not had botox for 5 months and her migraine frequency is 16 migraine days a month. No over the counter medications or medication overuse. The migraines are unilateral on the left behind the eyes to the back of her neck Pulsating,pounding, throbbing, they are moderately severe or severe, she has nausea, photo/phobia and phonophobia, movement makes them worse, a dark room makes it better. She uses triptan and phenergan. She lives in [place]. No aura. Migraines ongoing for years. Migraines can last 24 hours and average pain is 8/10 and most are severe. She has to close all her windows, all her doors, needs to be quiet. Never had side effects to Botox in the past. The migraines as so severe she may have to go to the emergency room. She has fibromyalgia. No weakness, no vision changes, no walking difficulties or any other focal neurologic findings and these migraines are the same as in the past.  No other focal neurologic deficits, associated symptoms,  inciting events or modifiable factors.  Medications failed: Topamax, cymbalta, lamictal, cyproheptadine   Review of Systems: Patient complains of symptoms per HPI as well as the following symptoms: fibromyalgia, migraines, nervous breakdown. Pertinent negatives and positives per HPI. All others negative.   Social History   Socioeconomic History   Marital status: Married    Spouse name: Not on file   Number of children: 2   Years of education: 42- [place]   Highest education level: Not on file  Occupational History   Occupation: retired  Scientist, product/process development strain: Not on file   Food insecurity:    Worry: Not on file    Inability: Not on Lexicographer needs:    Medical: Not on file    Non-medical: Not on file  Tobacco Use   Smoking status: Never Smoker   Smokeless tobacco: Never Used  Substance and Sexual Activity   Alcohol use: No   Drug use: No   Sexual activity: Not on file  Lifestyle   Physical activity:    Days per week: Not on file    Minutes per session: Not on file   Stress: Not on file  Relationships   Social connections:    Talks on phone: Not on file    Gets together: Not on file    Attends religious service: Not on file    Active member of club or organization: Not on file    Attends meetings of clubs or organizations: Not  on file    Relationship status: Not on file   Intimate partner violence:    Fear of current or ex partner: Not on file    Emotionally abused: Not on file    Physically abused: Not on file    Forced sexual activity: Not on file  Other Topics Concern   Not on file  Social History Narrative   Lives at home with her husband & dog   Right handed   Caffeine: none     Family History  Problem Relation Age of Onset   Cancer Father        Pancreatic-small cell carcinoma   Cancer Paternal Aunt    Cancer Paternal Uncle    Cancer Paternal Grandmother    Cancer Paternal Grandfather      Osteoporosis Mother    Fibromyalgia Mother    Osteoarthritis Mother    Migraines Mother    Migraines Sister    Migraines Brother    Migraines Cousin        several cousins   Migraines Son    Migraines Son     Past Medical History:  Diagnosis Date   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 in 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   Arthritis    Asthma    Endometriosis 4097   Hernia, umbilical 3532   Thyroid disease     Patient Active Problem List   Diagnosis Date Noted   DDD (degenerative disc disease), 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   History of migraine 02/07/2017   Osteopenia  08/12/2016   Obsessive-compulsive disorder 08/12/2016   Anxiety 08/12/2016   History of leukemia 08/08/2016   Fibromyalgia 08/08/2016   Chronic pain syndrome 08/08/2016   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   History of vertebral fracture 08/08/2016   Vitamin D deficiency 08/08/2016   Other fatigue 08/08/2016   Primary insomnia 08/08/2016   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[place]) 02/07/2012    Past Surgical History:  Procedure Laterality Date   ABDOMINAL HYSTERECTOMY  2011   Partial   ANTERIOR AND POSTERIOR VAGINAL REPAIR  05/2010   ANTERIOR AND POSTERIOR VAGINAL REPAIR  2010   APPENDECTOMY  1990   BACK SURGERY  10/29/07   L4, L5   BREAST LUMPECTOMY  2006   Left breast; benign   [place]  2011   Follow up   HERNIA REPAIR  03/2009   Triple Hernia & Endomnioplasty (reported per pt)   ROTATOR CUFF REPAIR  07/2011   ROUX-EN-Y GASTRIC BYPASS  2007   TONSILLECTOMY AND ADENOIDECTOMY  1979   TOTAL SHOULDER ARTHROPLASTY     TYMPANOSTOMY TUBE PLACEMENT  1968, 9924, 2683   UMBILICAL HERNIA REPAIR  2001    Current Outpatient Medications  Medication Sig Dispense Refill   acetaminophen (TYLENOL) 500 MG tablet Take 1  tablet (500 mg total) by mouth every 6 (six) hours as needed. (Patient not taking: Reported on 11/16/2018) 30 tablet 0   botulinum toxin Type A (BOTOX) 100 units SOLR injection INJECT UP TO 150-200 UNITS INTRAMUSCULARLY TO HEAD AND NECK EVERY 3 MONTHS (GIVEN AT MD OFFICE)     budesonide-formoterol (SYMBICORT) 160-4.5 MCG/ACT inhaler Inhale into the lungs as needed.      clonazePAM (KLONOPIN) 0.5 MG tablet Take 0.5 mg by mouth 3 (three) times daily.      cyclobenzaprine (FLEXERIL) 10 MG tablet TAKE ONE TABLET  DAILY AT BEDTIME 30 tablet 2   DULoxetine (CYMBALTA) 60 MG capsule Take 1 capsule (60 mg total) by mouth every morning. 30 capsule 2   eletriptan (RELPAX) 40 MG tablet Take 40 mg by mouth.     levothyroxine (SYNTHROID, LEVOTHROID) 112 MCG tablet Take 112 mcg by mouth daily before breakfast.     methocarbamol (ROBAXIN) 750 MG tablet TAKE 1 TABLET BY MOUTH TWICE(2) DAILY 180 tablet 0   Multiple Vitamins-Minerals (MULTIVITAMIN PO) Take 1 each by mouth daily.     pregabalin (LYRICA) 150 MG capsule TAKE 1 CAPSULE BY MOUTH THREE(3) TIMES DAILY 270 capsule 1   PROAIR HFA 108 (90 Base) MCG/ACT inhaler as needed.      promethazine (PHENERGAN) 25 MG tablet as needed.      QUEtiapine (SEROQUEL XR) 400 MG 24 hr tablet Take 400 mg by mouth.     rOPINIRole (REQUIP) 0.5 MG tablet Take 0.5 mg by mouth at bedtime.     vitamin C (ASCORBIC ACID) 500 MG tablet Take 500 mg by mouth daily.     No current facility-administered medications for this visit.     Allergies as of 11/16/2018 - Review Complete 11/16/2018  Allergen Reaction Noted   Asa [aspirin] Anaphylaxis 02/07/2012   Codeine Anaphylaxis 02/07/2012   Penicillins Anaphylaxis 02/07/2012   Relpax [eletriptan] Anaphylaxis 02/07/2012   Toradol [ketorolac tromethamine] Anaphylaxis 02/07/2012   Milk-related compounds Nausea And Vomiting 12/09/2014    Vitals: There were no vitals taken for this visit. Last Weight:  Wt Readings from  Last 1 Encounters:  11/16/18 219 lb (99.3 kg)   Last Height:   Ht Readings from Last 1 Encounters:  11/16/18 5\' 6"  (1.676 m)     Physical exam: Exam:    Physical exam: Exam: Gen: NAD, conversant      CV: attempted, Could not perform over Web Video  Eyes: Conjunctivae clear without exudates or hemorrhage  Neuro: Detailed Neurologic Exam  Speech:    Speech is normal; fluent and spontaneous with normal comprehension.  Cognition:    The patient is oriented to person, place, and time;     recent and remote memory intact;     language fluent;     normal attention, concentration,     fund of knowledge Cranial Nerves:    The pupils are equal, round, and reactive to light. Attempted, Cannot perform fundoscopic exam. Visual fields are full to finger confrontation. Extraocular movements are intact.  The face is symmetric with normal sensation. The palate elevates in the midline. Hearing intact. Voice is normal. Shoulder shrug is normal. The tongue has normal motion without fasciculations.   Coordination:    Normal finger to nose  Gait:    Normal native gait  Motor Observation:   no involuntary movements noted. Tone:    Appears normal  Posture:    Posture is normal. normal erect    Strength:    Strength is anti-gravity and symmetric in the upper and lower limbs.      Sensation: intact to LT     Reflex Exam:  DTR's:    Attempted, Could not perform over Web Video   Toes: Attempted Could not perform over Web Video  Clonus:   Attempted, Could not perform over Web Video     Assessment/Plan: This is a patient with chronic migraines.  She has done exceptionally well on Botox for migraines.  She typically has at least 16 migraine days a month over the last 5 months since she has  not had her Botox.  When she was getting Botox regularly she only had possibly 2 migraine days a month which is a greater than 95% improvement on Botox.  Unfortunately her provider is not there  anymore.  She will come in tomorrow for Botox, this is urgent patient feels if she does not get her Botox she will have to go to the emergency room night before her to come here. - botox tomorrow  Follow Up Instructions:    I discussed the assessment and treatment plan with the patient. The patient was provided an opportunity to ask questions and all were answered. The patient agreed with the plan and demonstrated an understanding of the instructions.   The patient was advised to call back or seek an in-person evaluation if the symptoms worsen or if the condition fails to improve as anticipated.  A total of 60 minutes was spent on video face-to-face(not in person) with this patient. Over half this time was spent on counseling patient on the No diagnosis found. diagnosis and different diagnostic and therapeutic options, counseling and coordination of care, risks ans benefits of management, compliance, or risk factor reduction and education.     Cc: Myrlene Broker, MD,    Sarina Ill, MD  [facility] [address] [place],  [address]  Phone [phone] Fax [phone]

## 2018-11-16 NOTE — Telephone Encounter (Signed)
Spoke with pt today, confirmed name & DOB. Pt provided updates to her med/soc/fam/sx history, weight/height, medications, allergies in preparation for her virtual appointment today with Dr. Jaynee Eagles.

## 2018-11-16 NOTE — Telephone Encounter (Signed)
Pt placed on schedule for botox tomorrow morning 9:00 AM per Dr. Jaynee Eagles. Pt screened by Dr. Jaynee Eagles, no recent travel, no exposure to anyone with covid 19 or PUI, no symptoms or fever.

## 2018-11-17 ENCOUNTER — Ambulatory Visit (INDEPENDENT_AMBULATORY_CARE_PROVIDER_SITE_OTHER): Payer: Medicare Other | Admitting: Neurology

## 2018-11-17 ENCOUNTER — Other Ambulatory Visit: Payer: Self-pay

## 2018-11-17 VITALS — Temp 97.7°F

## 2018-11-17 DIAGNOSIS — G43711 Chronic migraine without aura, intractable, with status migrainosus: Secondary | ICD-10-CM

## 2018-11-17 NOTE — Progress Notes (Signed)
Consent Form Botulism Toxin Injection For Chronic Migraine   Today is her first botox. Her baseline is 20-25 migraines a month. When she was on Botox it was reduced to 2 migraines a month. Re-initiating. Also temporarily using ajovy injected 3 samples.   Reviewed orally with patient, additionally signature is on file:  Botulism toxin has been approved by the Federal drug administration for treatment of chronic migraine. Botulism toxin does not cure chronic migraine and it may not be effective in some patients.  The administration of botulism toxin is accomplished by injecting a small amount of toxin into the muscles of the neck and head. Dosage must be titrated for each individual. Any benefits resulting from botulism toxin tend to wear off after 3 months with a repeat injection required if benefit is to be maintained. Injections are usually done every 3-4 months with maximum effect peak achieved by about 2 or 3 weeks. Botulism toxin is expensive and you should be sure of what costs you will incur resulting from the injection.  The side effects of botulism toxin use for chronic migraine may include:   -Transient, and usually mild, facial weakness with facial injections  -Transient, and usually mild, head or neck weakness with head/neck injections  -Reduction or loss of forehead facial animation due to forehead muscle weakness  -Eyelid drooping  -Dry eye  -Pain at the site of injection or bruising at the site of injection  -Double vision  -Potential unknown long term risks  Contraindications: You should not have Botox if you are pregnant, nursing, allergic to albumin, have an infection, skin condition, or muscle weakness at the site of the injection, or have myasthenia gravis, Lambert-Eaton syndrome, or ALS.  It is also possible that as with any injection, there may be an allergic reaction or no effect from the medication. Reduced effectiveness after repeated injections is sometimes seen  and rarely infection at the injection site may occur. All care will be taken to prevent these side effects. If therapy is given over a long time, atrophy and wasting in the muscle injected may occur. Occasionally the patient's become refractory to treatment because they develop antibodies to the toxin. In this event, therapy needs to be modified.  I have read the above information and consent to the administration of botulism toxin.    BOTOX PROCEDURE NOTE FOR MIGRAINE HEADACHE    Contraindications and precautions discussed with patient(above). Aseptic procedure was observed and patient tolerated procedure. Procedure performed by Dr. Georgia Dom  The condition has existed for more than 6 months, and pt does not have a diagnosis of ALS, Myasthenia Gravis or Lambert-Eaton Syndrome.  Risks and benefits of injections discussed and pt agrees to proceed with the procedure.  Written consent obtained  These injections are medically necessary. Pt  receives good benefits from these injections. These injections do not cause sedations or hallucinations which the oral therapies may cause.  Description of procedure:  The patient was placed in a sitting position. The standard protocol was used for Botox as follows, with 5 units of Botox injected at each site:   -Procerus muscle, midline injection  -Corrugator muscle, bilateral injection  -Frontalis muscle, bilateral injection, with 2 sites each side, medial injection was performed in the upper one third of the frontalis muscle, in the region vertical from the medial inferior edge of the superior orbital rim. The lateral injection was again in the upper one third of the forehead vertically above the lateral limbus of the cornea,  1.5 cm lateral to the medial injection site.  - Levator Scapulae: 5 units bilaterally  -Temporalis muscle injection, 5 sites, bilaterally. The first injection was 3 cm above the tragus of the ear, second injection site was 1.5 cm  to 3 cm up from the first injection site in line with the tragus of the ear. The third injection site was 1.5-3 cm forward between the first 2 injection sites. The fourth injection site was 1.5 cm posterior to the second injection site. 5th site laterally in the temporalis  muscleat the level of the outer canthus.  - Patient feels her clenching is a trigger for headaches. +5 units masseter bilaterally   - Patient feels the migraines are centered around the eyes +5 units bilaterally at the outer canthus in the orbicularis occuli  -Occipitalis muscle injection, 3 sites, bilaterally. The first injection was done one half way between the occipital protuberance and the tip of the mastoid process behind the ear. The second injection site was done lateral and superior to the first, 1 fingerbreadth from the first injection. The third injection site was 1 fingerbreadth superiorly and medially from the first injection site.  -Cervical paraspinal muscle injection, 2 sites, bilateral knee first injection site was 1 cm from the midline of the cervical spine, 3 cm inferior to the lower border of the occipital protuberance. The second injection site was 1.5 cm superiorly and laterally to the first injection site.  -Trapezius muscle injection was performed at 3 sites, bilaterally. The first injection site was in the upper trapezius muscle halfway between the inflection point of the neck, and the acromion. The second injection site was one half way between the acromion and the first injection site. The third injection was done between the first injection site and the inflection point of the neck.   Will return for repeat injection in 3 months.   A 200 unit sof Botox was used, any Botox not injected was wasted. The patient tolerated the procedure well, there were no complications of the above procedure.

## 2018-11-17 NOTE — Progress Notes (Signed)
Botox- 100 units x 2 vials Lot: [id number] Expiration: 04/2021 NDC: 9923-4144-36  Bacteriostatic 0.9% Sodium Chloride- 48mL total Lot: [id number] Expiration: 05/13/2019 NDC: 0634-9494-47  Dx: X95.844 B/B Patient signed consent

## 2018-11-18 ENCOUNTER — Ambulatory Visit: Payer: Self-pay | Admitting: Neurology

## 2018-11-20 ENCOUNTER — Encounter: Payer: Self-pay | Admitting: Neurology

## 2019-01-13 ENCOUNTER — Other Ambulatory Visit: Payer: Self-pay | Admitting: Rheumatology

## 2019-01-13 ENCOUNTER — Telehealth: Payer: Self-pay | Admitting: Rheumatology

## 2019-01-13 ENCOUNTER — Other Ambulatory Visit: Payer: Self-pay | Admitting: Physician Assistant

## 2019-01-13 NOTE — Telephone Encounter (Signed)
Patient advised prescriptions for or Cyclobenzaprine and Methocarbamol have been sent to the pharmacy. Patient advised her Lyrica has an additional refill at the pharmacy  So she should contact them for the refill. Patient verbalized understanding.

## 2019-01-13 NOTE — Telephone Encounter (Signed)
Patient left a voicemail requesting prescription refills for Cyclobenzaprine, Lyrica, and Methocarbamol to be sent to [facility].

## 2019-01-13 NOTE — Telephone Encounter (Signed)
Last Visit: 11/09/18 Next Visit: 03/04/19  Okay to refill per Dr. Estanislado Pandy

## 2019-02-18 ENCOUNTER — Other Ambulatory Visit: Payer: Self-pay | Admitting: Rheumatology

## 2019-02-18 NOTE — Telephone Encounter (Signed)
Last Visit: 11/09/18 Next Visit: 03/04/19  Okay to refill per Dr. Estanislado Pandy

## 2019-02-26 ENCOUNTER — Telehealth: Payer: Self-pay | Admitting: Hematology & Oncology

## 2019-02-26 NOTE — Telephone Encounter (Signed)
Called and spoke with patient's husband and advised him that she would need a referral in order to have any labs performed here per 7/17 staff message.  He voiced understanding of this and stated he would call her PCP

## 2019-03-03 NOTE — Progress Notes (Signed)
Office Visit Note  Patient: Cindy Nunez             Date of Birth: Jul 06, 1966           MRN: [id number]             PCP: Myrlene Broker, MD Referring: Myrlene Broker, MD Visit Date: 03/17/2019 Occupation: '@GUAROCC' @  Subjective:  Trochanteric bursitis bilaterally   History of Present Illness: Cindy Nunez is a 53 y.o. female with history of fibromyalgia and DDD.  She takes Lyrica 150 mg 1 capsule by mouth 3 times daily and Cymbalta 60 mg by mouth daily.  She continues to take Robaxin 750 mg 1 tablet by mouth twice daily and Flexeril 10 mg by mouth at bedtime for muscle spasms.  She has been having more frequent and severe fibromyalgia flares.  She is having generalized muscle aches and muscle tenderness.  She is having bilateral trochanteric bursitis, and she would like cortisone injections bilaterally.  She continues to have pain in both hands and both knee joints.  She uses voltaren gel topically on both knee joints.  She denies any joint swelling.  She states she was previously receiving botox injections every 3 months for migraines but she is 2 months behind due to the covid-19 pandemic.  She is scheduled for her botox injections on 03/30/19. She has been having more frequent migraines since she missed her last injection.    Activities of Daily Living:  Patient reports morning stiffness for 2-3 hours.   Patient Reports nocturnal pain.  Difficulty dressing/grooming: Denies Difficulty climbing stairs: Reports Difficulty getting out of chair: Denies Difficulty using hands for taps, buttons, cutlery, and/or writing: Denies  Review of Systems  Constitutional: Positive for fatigue.  HENT: Positive for mouth dryness. Negative for mouth sores and nose dryness.   Eyes: Negative for pain, visual disturbance and dryness.  Respiratory: Negative for cough, hemoptysis, shortness of breath and difficulty breathing.   Cardiovascular: Negative for chest pain, palpitations, hypertension and  swelling in legs/feet.  Gastrointestinal: Positive for constipation. Negative for blood in stool and diarrhea.  Endocrine: Negative for increased urination.  Genitourinary: Negative for painful urination.  Musculoskeletal: Positive for arthralgias, joint pain, joint swelling, morning stiffness and muscle tenderness. Negative for myalgias, muscle weakness and myalgias.  Skin: Negative for color change, pallor, rash, hair loss, nodules/bumps, skin tightness, ulcers and sensitivity to sunlight.  Allergic/Immunologic: Negative for susceptible to infections.  Neurological: Positive for headaches. Negative for dizziness, numbness and memory loss.  Hematological: Negative for swollen glands.  Psychiatric/Behavioral: Positive for depressed mood. Negative for confusion and sleep disturbance. The patient is nervous/anxious.     PMFS History:  Patient Active Problem List   Diagnosis Date Noted  . Chronic migraine without aura, with intractable migraine, so stated, with status migrainosus 11/16/2018  . DDD (degenerative disc disease), 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  . History of migraine 02/07/2017  . Osteopenia  08/12/2016  . Obsessive-compulsive disorder 08/12/2016  . Anxiety 08/12/2016  . History of leukemia 08/08/2016  . Fibromyalgia 08/08/2016  . Chronic pain syndrome 08/08/2016  . 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  . History of vertebral fracture 08/08/2016  . Vitamin D deficiency 08/08/2016  . Other fatigue 08/08/2016  . Primary insomnia 08/08/2016  . 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 Arthritis   . Asthma   . Endometriosis 1989  . Hernia, umbilical 4163  . Thyroid disease     Family History  Problem Relation Age of Onset  . Cancer Father        Pancreatic-small  cell carcinoma  . Cancer Paternal Aunt   . Cancer Paternal Uncle   . Cancer Paternal Grandmother   . Cancer Paternal Grandfather   . Osteoporosis Mother   . Fibromyalgia Mother   . Osteoarthritis Mother   . Migraines Mother   . Migraines Sister   . Migraines Brother   . Migraines Cousin        several cousins  . Migraines Son   . Migraines Son    Past Surgical History:  Procedure Laterality Date  . ABDOMINAL HYSTERECTOMY  2011   Partial  . ANTERIOR AND POSTERIOR VAGINAL REPAIR  05/2010  . ANTERIOR AND POSTERIOR VAGINAL REPAIR  2010  . APPENDECTOMY  1990  . BACK SURGERY  10/29/07   L4, L5  . BREAST LUMPECTOMY  2006   Left breast; benign  . CESAREAN SECTION  1993  . CHOLECYSTECTOMY  1990  . HERNIA REPAIR  2011   Follow up  . HERNIA REPAIR  03/2009   Triple Hernia & Endomnioplasty (reported per pt)  . ROTATOR CUFF REPAIR  07/2011  . ROUX-EN-Y GASTRIC BYPASS  2007  . TONSILLECTOMY AND ADENOIDECTOMY  1979  . TOTAL SHOULDER ARTHROPLASTY    . TYMPANOSTOMY [place], 1978  . UMBILICAL HERNIA REPAIR  2001   Social History   Social History Narrative   Lives at home with her husband & dog   Right handed   Caffeine: none     There is no immunization history on file for this patient.   Objective: Vital Signs: BP 128/84 (BP Location: Left Arm, Patient Position: Sitting, Cuff Size: Large)   Pulse 91   Resp 13   Ht '5\' 6"'  (1.676 m)   Wt 214 lb 6.4 oz (97.3 kg)   BMI 34.61 kg/m    Physical Exam Vitals signs and nursing note reviewed.  Constitutional:      Appearance: She is well-developed.  HENT:     Head: Normocephalic and atraumatic.  Eyes:     Conjunctiva/sclera: Conjunctivae normal.  Neck:     Musculoskeletal: Normal range of motion.  Cardiovascular:     Rate and Rhythm: Normal rate and regular rhythm.     Heart sounds: Normal heart sounds.  Pulmonary:     Effort: Pulmonary effort is normal.     Breath sounds: Normal breath sounds.   Abdominal:     General: Bowel sounds are normal.     Palpations: Abdomen is soft.  Lymphadenopathy:     Cervical: No cervical adenopathy.  Skin:    General: Skin is warm and dry.     Capillary Refill: Capillary refill takes less than 2 seconds.  Neurological:     Mental Status: She is alert and oriented to person, place, and time.  Psychiatric:        Behavior: Behavior normal.      Musculoskeletal Exam: C-spine, thoracic spine, lumbar spine good range of motion.  No midline spinal tenderness.  No SI joint tenderness.  Shoulder joints, elbow joints, wrist joints, MCPs, PIPs, DIPs good range of motion no synovitis.  She has complete fist formation bilaterally.  Hip joints, knee joints, ankle joints, MTPs, PIPs, DIPs good range of motion no synovitis.  No warmth or effusion of bilateral knee joints.  No tenderness or swelling of ankle joints.  No tenderness of MTP joints.  She has tenderness over bilateral trochanteric bursa.  CDAI Exam: CDAI Score: - Patient Global: -; Provider Global: - Swollen: -; Tender: - Joint Exam   No joint exam has been documented for this visit   There is currently no information documented on the homunculus. Go to the Rheumatology activity and complete the homunculus joint exam.  Investigation: No additional findings.  Imaging: No results found.  Recent Labs: Lab Results  Component Value Date   WBC (LL) 11/01/2010    0.6 REPEATED TO VERIFY CRITICAL RESULT CALLED TO, READ BACK BY AND VERIFIED WITH: BRYANT,T. RN AT 0805 ON 11/01/10 BY GILLESPIE,B. RARE NRBCs SEE BONE MARROW [id number]   HGB 10.7 (L) 11/01/2010   PLT (LL) 11/01/2010    22 REPEATED TO VERIFY PLATELET COUNT CONFIRMED BY SMEAR CRITICAL RESULT CALLED TO, READ BACK BY AND VERIFIED WITH: BRYANT,T. RN AT 0805 ON 11/01/10 BY GILLESPIE,B. SPECIMEN CHECKED FOR CLOTS   NA 142 10/29/2010   K 3.4 (L) 10/29/2010   CL 110 10/29/2010   CO2 19 10/29/2010   GLUCOSE 132 (H) 10/29/2010   BUN 24 (H)  10/29/2010   CREATININE 0.93 10/29/2010   BILITOT 0.3 10/29/2010   ALKPHOS 98 10/29/2010   AST 15 10/29/2010   ALT 17 10/29/2010   PROT 6.2 10/29/2010   ALBUMIN 3.9 10/29/2010   CALCIUM 8.8 10/29/2010   GFRAA  11/09/2007    >60        The eGFR has been calculated using the MDRD equation. This calculation has not been validated in all clinical    Speciality Comments: No specialty comments available.  Procedures:  Large Joint Inj: bilateral greater trochanter on 03/17/2019 11:18 AM Indications: pain Details: 27 G 1.5 in needle, lateral approach  Arthrogram: No  Medications (Right): 1.5 mL lidocaine 1 %; 40 mg triamcinolone acetonide 40 MG/ML Aspirate (Right): 0 mL Medications (Left): 1.5 mL lidocaine 1 %; 40 mg triamcinolone acetonide 40 MG/ML Aspirate (Left): 0 mL Outcome: tolerated well, no immediate complications Procedure, treatment alternatives, risks and benefits explained, specific risks discussed. Consent was given by the patient. Immediately prior to procedure a time out was called to verify the correct patient, procedure, equipment, support staff and site/side marked as required. Patient was prepped and draped in the usual sterile fashion.     Allergies: Asa [aspirin], Codeine, Penicillins, Relpax [eletriptan], Toradol [ketorolac tromethamine], and Milk-related compounds   Assessment / Plan:     Visit Diagnoses: Fibromyalgia - She has generalized hyperalgesia and positive tender points on exam.  She has been having more frequent and severe fibromyalgia flares.  She has generalized muscle aches and muscle tenderness at this time.  She has been experiencing bilateral trochanteric bursitis and requested cortisone injections bilaterally.  She tolerated the procedure well.  She continues to take Lyrica 150 mg 3 times daily and Cymbalta 60 mg 1 capsule by mouth daily.  She takes Robaxin 750 mg twice daily and Flexeril 10 mg by mouth at bedtime for muscle spasms.  Her current  treatment regimen has been effective.  Refills of Robaxin, Cymbalta, and Lyrica were sent to the pharmacy today.  She was encouraged to stay active and exercise on a regular basis.  Good sleep hygiene was discussed.  She will follow-up in the office in 6 months.  Other fatigue - She has chronic fatigue related to insomnia.  She  has been exercising on a regular basis.  Primary insomnia - She takes Flexeril 10 mg 1 tablet po at bedtime as needed for muscle spasms and insomnia.  She experiences nocturnal pain at times which causes interrupted sleep at night.  Good sleep hygiene was discussed.  Trochanteric bursitis of both hips - She has tenderness over bilateral trochanteric bursa.  She has been experiencing nocturnal pain when lying on her sides at night.  She requested bilateral cortisone injections.  She tolerated procedure well.  The procedure note was completed above.  She was encouraged to perform stretching exercises on a regular basis.  Plan: Large Joint Inj: bilateral greater trochanter  DDD (degenerative disc disease), lumbar - S/p fusion -She has good range of motion with no discomfort.  No midline spinal tenderness.  DDD (degenerative disc disease), thoracic - She has no midline spinal tenderness at this time.  Chronic pain syndrome -She takes Lyrica 150 mg by mouth TID and Cymbalta 60 mg 1 capsule by mouth daily.  Refills of Lyrica and Cymbalta were sent to the pharmacy.  Osteopenia of multiple sites - DEXAs ordered by PCP.  History of migraine: She has been seeing Dr. Jaynee Eagles for Botox injections every 3 months.  Her next injection is on 03/30/2019.  She has been having more frequent migraines since she missed her last Botox injection.  Other medical conditions are listed as follows:  History of obsessive compulsive disorder  History of vitamin D deficiency  History of gastric bypass   History of anxiety  History of vertebral fracture   History of leukemia   Orders: Orders  Placed This Encounter  Procedures  . Large Joint Inj: bilateral greater trochanter   Meds ordered this encounter  Medications  . DULoxetine (CYMBALTA) 60 MG capsule    Sig: Take 1 capsule (60 mg total) by mouth every morning.    Dispense:  90 capsule    Refill:  0  . pregabalin (LYRICA) 150 MG capsule    Sig: TAKE 1 CAPSULE BY MOUTH THREE(3) TIMES DAILY    Dispense:  270 capsule    Refill:  1  . methocarbamol (ROBAXIN) 750 MG tablet    Sig: TAKE ONE TABLET BY MOUTH TWICE A DAY    Dispense:  180 tablet    Refill:  0    Face-to-face time spent with patient was 30 minutes. Greater than 50% of time was spent in counseling and coordination of care.  Follow-Up Instructions: Return in about 6 months (around 09/17/2019) for Fibromyalgia, DDD.   Ofilia Neas, PA-C  Note - This record has been created using Dragon software.  Chart creation errors have been sought, but may not always  have been located. Such creation errors do not reflect on  the standard of medical care.

## 2019-03-04 ENCOUNTER — Ambulatory Visit: Payer: Self-pay | Admitting: Rheumatology

## 2019-03-15 ENCOUNTER — Telehealth: Payer: Self-pay | Admitting: Neurology

## 2019-03-15 NOTE — Telephone Encounter (Signed)
I called the patient's insurance and requested coverage information pre cert requirements. The representative state NPR Ref#[id number]. DW

## 2019-03-16 ENCOUNTER — Other Ambulatory Visit: Payer: Self-pay | Admitting: Rheumatology

## 2019-03-16 NOTE — Telephone Encounter (Signed)
Pt is wanting to r/s her Botox

## 2019-03-16 NOTE — Telephone Encounter (Signed)
Last Visit: 11/09/18  Next Visit: 03/17/19  Okay to refill per Dr. Estanislado Pandy

## 2019-03-17 ENCOUNTER — Other Ambulatory Visit: Payer: Self-pay

## 2019-03-17 ENCOUNTER — Ambulatory Visit (INDEPENDENT_AMBULATORY_CARE_PROVIDER_SITE_OTHER): Payer: Medicare Other | Admitting: Physician Assistant

## 2019-03-17 ENCOUNTER — Encounter: Payer: Self-pay | Admitting: Physician Assistant

## 2019-03-17 ENCOUNTER — Telehealth: Payer: Self-pay | Admitting: Neurology

## 2019-03-17 VITALS — BP 128/84 | HR 91 | Resp 13 | Ht 66.0 in | Wt 214.4 lb

## 2019-03-17 DIAGNOSIS — M7062 Trochanteric bursitis, left hip: Secondary | ICD-10-CM | POA: Diagnosis not present

## 2019-03-17 DIAGNOSIS — F5101 Primary insomnia: Secondary | ICD-10-CM | POA: Diagnosis not present

## 2019-03-17 DIAGNOSIS — M7061 Trochanteric bursitis, right hip: Secondary | ICD-10-CM | POA: Diagnosis not present

## 2019-03-17 DIAGNOSIS — R5383 Other fatigue: Secondary | ICD-10-CM

## 2019-03-17 DIAGNOSIS — Z856 Personal history of leukemia: Secondary | ICD-10-CM

## 2019-03-17 DIAGNOSIS — M5134 Other intervertebral disc degeneration, thoracic region: Secondary | ICD-10-CM

## 2019-03-17 DIAGNOSIS — Z8781 Personal history of (healed) traumatic fracture: Secondary | ICD-10-CM

## 2019-03-17 DIAGNOSIS — Z8659 Personal history of other mental and behavioral disorders: Secondary | ICD-10-CM

## 2019-03-17 DIAGNOSIS — Z8669 Personal history of other diseases of the nervous system and sense organs: Secondary | ICD-10-CM

## 2019-03-17 DIAGNOSIS — G894 Chronic pain syndrome: Secondary | ICD-10-CM

## 2019-03-17 DIAGNOSIS — M8589 Other specified disorders of bone density and structure, multiple sites: Secondary | ICD-10-CM

## 2019-03-17 DIAGNOSIS — Z8639 Personal history of other endocrine, nutritional and metabolic disease: Secondary | ICD-10-CM

## 2019-03-17 DIAGNOSIS — M5136 Other intervertebral disc degeneration, lumbar region: Secondary | ICD-10-CM

## 2019-03-17 DIAGNOSIS — M797 Fibromyalgia: Secondary | ICD-10-CM

## 2019-03-17 DIAGNOSIS — Z9884 Bariatric surgery status: Secondary | ICD-10-CM

## 2019-03-17 MED ORDER — PREGABALIN 150 MG PO CAPS: ORAL_CAPSULE | ORAL | 1 refills | Status: DC

## 2019-03-17 MED ORDER — METHOCARBAMOL 750 MG PO TABS: ORAL_TABLET | ORAL | 0 refills | Status: DC

## 2019-03-17 MED ORDER — TRIAMCINOLONE ACETONIDE 40 MG/ML IJ SUSP
40.0000 mg | INTRAMUSCULAR | Status: AC | PRN
  Administered 2019-03-17: 40 mg via INTRA_ARTICULAR

## 2019-03-17 MED ORDER — DULOXETINE HCL 60 MG PO CPEP: 60.0000 mg | ORAL_CAPSULE | ORAL | 0 refills | Status: AC

## 2019-03-17 MED ORDER — LIDOCAINE HCL 1 % IJ SOLN
1.5000 mL | INTRAMUSCULAR | Status: AC | PRN
  Administered 2019-03-17: 1.5 mL

## 2019-03-17 NOTE — Telephone Encounter (Signed)
Called pt back to inform her. Pt is not able to come in on the 11th so she was r/s for the 18th. Please advise.

## 2019-03-17 NOTE — Telephone Encounter (Signed)
Noted That was fine . Andee Poles out of the office 03/17/2019

## 2019-03-17 NOTE — Telephone Encounter (Signed)
I am so sorry but I do not have any availability today. Dr Jaynee Eagles is out of the office today.

## 2019-03-17 NOTE — Telephone Encounter (Signed)
Pt called wanting to know if she can come in this morning for her BOTOX appt instead of the 11th since she is in town for another appt. Pt lives an hour away and is wanting to avoid an extra trip. Her other appt is at 10:30 so she would like to know if she can come after that. Please advise.

## 2019-03-18 ENCOUNTER — Telehealth: Payer: Self-pay | Admitting: Rheumatology

## 2019-03-18 NOTE — Telephone Encounter (Signed)
Patient states she had gastric bypass surgery and sometimes when she takes her medication she throws it all up.  Patient states she spoke with Lovena Le about getting pain medication, but forgot to mention it to Dr. Estanislado Pandy.  Patient states she threw up her medication last night and now is experiencing a lot of pain.  Patient is requesting pain medication to be sent to [facility] at [facility]

## 2019-03-18 NOTE — Telephone Encounter (Signed)
Dr. Estanislado Pandy suggested a referral to pain management.  She does not want to start the patient on narcotics at this time.  We are prescribing Lyrica and cymbalta for pain management.

## 2019-03-18 NOTE — Telephone Encounter (Signed)
Spoke with patient's husband Juanda Crumble who is on dpr, advised that we can offer a referral to pain management. Advised we are unable to prescribe narcotics. Advised we are prescribing Cymbalta and Lyrica for pain management. Advised to have patient call the office if she is interested in pain management referral.

## 2019-03-23 ENCOUNTER — Ambulatory Visit: Payer: Medicare Other | Admitting: Neurology

## 2019-03-29 NOTE — Telephone Encounter (Signed)
Noted, patient has been rescheduled. DW

## 2019-03-30 ENCOUNTER — Other Ambulatory Visit: Payer: Self-pay

## 2019-03-30 ENCOUNTER — Ambulatory Visit (INDEPENDENT_AMBULATORY_CARE_PROVIDER_SITE_OTHER): Payer: Medicare Other | Admitting: Neurology

## 2019-03-30 VITALS — Temp 98.0°F

## 2019-03-30 DIAGNOSIS — G43711 Chronic migraine without aura, intractable, with status migrainosus: Secondary | ICD-10-CM

## 2019-03-30 MED ORDER — AIMOVIG 140 MG/ML ~~LOC~~ SOAJ: 140.0000 mg | SUBCUTANEOUS | 11 refills | Status: DC

## 2019-03-30 NOTE — Progress Notes (Signed)
Botox- 100 units x 2 vials Lot: [id number] Expiration: 07/2021 NDC: 0023-1145-01  Bacteriostatic 0.9% Sodium Chloride- 4mL total Lot: [id number] Expiration: 05/13/2019 NDC: 0409-1966-02  Dx: G43.711 B/B   

## 2019-03-30 NOTE — Progress Notes (Signed)
Consent Form Botulism Toxin Injection For Chronic Migraine   Interval history 03/30/2019: Today is her second botox. Her baseline is 20-25 migraines a month. When she was on Botox in the past he frequency was reduced to 2 migraines a month. Re-initiated in April. Also temporarily using ajovy injected 3 samples. Today is her second botox. She did really well on botox. Reduced to 2 migraine days a month July,  in the last week worsening due to wearing off will prescribe Aimovig due to worsening migraines as botox wears off, would be good to have both on board if insurance will pay for it. +temples, +masseters, +OO, no LS  Reviewed orally with patient, additionally signature is on file:  Botulism toxin has been approved by the Federal drug administration for treatment of chronic migraine. Botulism toxin does not cure chronic migraine and it may not be effective in some patients.  The administration of botulism toxin is accomplished by injecting a small amount of toxin into the muscles of the neck and head. Dosage must be titrated for each individual. Any benefits resulting from botulism toxin tend to wear off after 3 months with a repeat injection required if benefit is to be maintained. Injections are usually done every 3-4 months with maximum effect peak achieved by about 2 or 3 weeks. Botulism toxin is expensive and you should be sure of what costs you will incur resulting from the injection.  The side effects of botulism toxin use for chronic migraine may include:   -Transient, and usually mild, facial weakness with facial injections  -Transient, and usually mild, head or neck weakness with head/neck injections  -Reduction or loss of forehead facial animation due to forehead muscle weakness  -Eyelid drooping  -Dry eye  -Pain at the site of injection or bruising at the site of injection  -Double vision  -Potential unknown long term risks  Contraindications: You should not have Botox if you  are pregnant, nursing, allergic to albumin, have an infection, skin condition, or muscle weakness at the site of the injection, or have myasthenia gravis, Lambert-Eaton syndrome, or ALS.  It is also possible that as with any injection, there may be an allergic reaction or no effect from the medication. Reduced effectiveness after repeated injections is sometimes seen and rarely infection at the injection site may occur. All care will be taken to prevent these side effects. If therapy is given over a long time, atrophy and wasting in the muscle injected may occur. Occasionally the patient's become refractory to treatment because they develop antibodies to the toxin. In this event, therapy needs to be modified.  I have read the above information and consent to the administration of botulism toxin.    BOTOX PROCEDURE NOTE FOR MIGRAINE HEADACHE    Contraindications and precautions discussed with patient(above). Aseptic procedure was observed and patient tolerated procedure. Procedure performed by Dr. Georgia Dom  The condition has existed for more than 6 months, and pt does not have a diagnosis of ALS, Myasthenia Gravis or Lambert-Eaton Syndrome.  Risks and benefits of injections discussed and pt agrees to proceed with the procedure.  Written consent obtained  These injections are medically necessary. Pt  receives good benefits from these injections. These injections do not cause sedations or hallucinations which the oral therapies may cause.  Description of procedure:  The patient was placed in a sitting position. The standard protocol was used for Botox as follows, with 5 units of Botox injected at each site:   -  Procerus muscle, midline injection  -Corrugator muscle, bilateral injection  -Frontalis muscle, bilateral injection, with 2 sites each side, medial injection was performed in the upper one third of the frontalis muscle, in the region vertical from the medial inferior edge of the  superior orbital rim. The lateral injection was again in the upper one third of the forehead vertically above the lateral limbus of the cornea, 1.5 cm lateral to the medial injection site.  -Temporalis muscle injection, 5 sites, bilaterally. The first injection was 3 cm above the tragus of the ear, second injection site was 1.5 cm to 3 cm up from the first injection site in line with the tragus of the ear. The third injection site was 1.5-3 cm forward between the first 2 injection sites. The fourth injection site was 1.5 cm posterior to the second injection site. 5th site laterally in the temporalis  muscleat the level of the outer canthus.  - Patient feels her clenching is a trigger for headaches. +5 units masseter bilaterally   - Patient feels the migraines are centered around the eyes +5 units bilaterally at the outer canthus in the orbicularis occuli  -Occipitalis muscle injection, 3 sites, bilaterally. The first injection was done one half way between the occipital protuberance and the tip of the mastoid process behind the ear. The second injection site was done lateral and superior to the first, 1 fingerbreadth from the first injection. The third injection site was 1 fingerbreadth superiorly and medially from the first injection site.  -Cervical paraspinal muscle injection, 2 sites, bilateral knee first injection site was 1 cm from the midline of the cervical spine, 3 cm inferior to the lower border of the occipital protuberance. The second injection site was 1.5 cm superiorly and laterally to the first injection site.  -Trapezius muscle injection was performed at 3 sites, bilaterally. The first injection site was in the upper trapezius muscle halfway between the inflection point of the neck, and the acromion. The second injection site was one half way between the acromion and the first injection site. The third injection was done between the first injection site and the inflection point of the  neck.   Will return for repeat injection in 3 months.   A 200 unit sof Botox was used, any Botox not injected was wasted. The patient tolerated the procedure well, there were no complications of the above procedure.

## 2019-03-31 ENCOUNTER — Telehealth: Payer: Self-pay | Admitting: Neurology

## 2019-03-31 NOTE — Telephone Encounter (Signed)
She will call back if she has any trouble.

## 2019-03-31 NOTE — Telephone Encounter (Signed)
Clarification, Aimovig was ordered yesterday. I called the pt and discussed the Aimovig savings card which can be downloaded/activated on [email or web address]. She understands she can use it to pickup the first dose which should be free. Going forward if insurance approves, the card will allow her to pay $5/month and if insurance denies, the medication will be free with the card.

## 2019-03-31 NOTE — Telephone Encounter (Signed)
Pt said she was told her cost for the Emgality would be $700.00 please call

## 2019-04-06 NOTE — Telephone Encounter (Signed)
Completed Aimovig 140 mg PA on CMM. Key: [id number]. Awaiting [facility] determination.

## 2019-04-07 ENCOUNTER — Encounter: Payer: Self-pay | Admitting: *Deleted

## 2019-04-07 ENCOUNTER — Telehealth: Payer: Self-pay | Admitting: Neurology

## 2019-04-07 NOTE — Telephone Encounter (Signed)
Let her know, she will have to try another medication before they will approve the cgrp unless she can think of any there med we missed?

## 2019-04-07 NOTE — Telephone Encounter (Signed)
she has failed Medications failed: Topamax, cymbalta, lamictal, cyproheptadine

## 2019-04-07 NOTE — Telephone Encounter (Signed)
Copied straight from mychart message from patient:   Past or current medications taken for depression, anxiety, pain and migraine:  Lamictal/klonipin  neuritin/Gabapentin (300/600mg )  Lexapro  Wellbutrin 300mg   Darvocet 100/650 (surgery)  Cyproheptad/Periactin 4mg   Relpax 40mg   Toredal  Phenergin & Benadryl  Norco  Hydrocondone 5/500  Lidoderm patches 700mg   Robaxin 500mg  x2  Oxycodone 5mg   Caffine 100mg   Topamax 200mg     Thank-you  Remer Macho   Dr. Jaynee Eagles, please advise what should be done next? Patient has tried the above medications however only the Topamax counts towards her required medications for the Aimovig approval. She would have to try or have a contraindication to the drugs listed below.

## 2019-04-07 NOTE — Telephone Encounter (Signed)
Pt called wanting to go over her medications with the RN. Pt tried to communicate via MyChart and was not able to do so. MyChart IT number was given.

## 2019-04-07 NOTE — Telephone Encounter (Signed)
Order form completed and ready for MD signature.

## 2019-04-07 NOTE — Telephone Encounter (Signed)
Received Aimovig denial from [facility]. Pt must have tried and failed (after a trial of at least 2 months), contraindication, or intolerance to two of the following: Amitriptyline, Beta blocker (propranolol, atenolol, metoprolol, nadolol, or timolol), Divalproex sodium (IR or ER), Botox, Topiramate, Venlafaxine.   Pt has only tried and failed the Topiramate. Unfortunately with her having medicare, she will not be able to use the savings card.  If we should choose to appeal, fax to [employer] [place] appeals and grievance dept at [phone] or call [phone].  Reference # [id number]- [id number]

## 2019-04-07 NOTE — Telephone Encounter (Signed)
Will you send [facility] Genetic Testing for idiopathic polyneuropathy including amyloidosis please? 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 thanks

## 2019-04-07 NOTE — Telephone Encounter (Signed)
I spoke with the patient and let her know the Aimovig is not covered d/t pt not meeting requirements with tried/failed medications. She stated she has also tried Imitrex, relpax, and 2 other medications for prevention that she cannot think of. I asked the pt if she could try to find them and/or call a previous physician's office to get the names. Pt stated she would. I asked her to message [place] through mychart. She verbalized understanding and appreciation.

## 2019-04-07 NOTE — Telephone Encounter (Signed)
I sent pt a mychart message to see if she can respond there to get a thread going.

## 2019-04-08 NOTE — Telephone Encounter (Signed)
Order form signed by Dr. Jaynee Eagles and faxed to [facility]. Received a receipt of confirmation. Note included on cover sheet to mail kit to pt's home and contact pt.

## 2019-04-09 NOTE — Telephone Encounter (Signed)
I agree, please ask her if she is willing to try a low-dose beta-blocker like propranolol. In the meantime we can give her samples if needed until medicare will approve. We can try propranolol 10mg  bid dispense 60 refill3. And we can slowly titrate. How long does she need to be on this before we can try again?   Stop immediately for: slow or uneven heartbeats;a light-headed feeling, like you might pass out;wheezing or trouble breathing;shortness of breath (even with mild exertion). This is not a complete list stop for anything concerning.

## 2019-04-13 ENCOUNTER — Encounter: Payer: Self-pay | Admitting: *Deleted

## 2019-04-13 MED ORDER — PROPRANOLOL HCL 10 MG PO TABS: 10.0000 mg | ORAL_TABLET | Freq: Two times a day (BID) | ORAL | 3 refills | Status: DC

## 2019-04-13 MED ORDER — PROPRANOLOL HCL 10 MG PO TABS: 10.0000 mg | ORAL_TABLET | Freq: Two times a day (BID) | ORAL | 2 refills | Status: DC

## 2019-04-13 NOTE — Telephone Encounter (Signed)
I spoke with the patient and discussed. She agrees to try Propranolol 10 mg BID. She is currently out of town and will start this next week. Pt understands to take 1 tablet twice a day/12 hours apart. She was advised to Stop immediately for: slow or uneven heartbeats;a light-headed feeling, like you might pass out;wheezing or trouble breathing;shortness of breath (even with mild exertion). Pt advised this is not a complete list and more info would be included with medication. I encouraged pt to update [place] in a few weeks on how the med is working unless she needs [place] sooner. Pt requested the information to be sent to her as well and I advise a mychart message would be sent. She verbalized appreciation for the call.   Propranolol 10 mg BID e-scribed to [facility]. #60, refills 3.

## 2019-04-13 NOTE — Addendum Note (Signed)
Addended by: Gildardo Griffes on: 04/13/2019 05:19 PM   Modules accepted: Orders

## 2019-05-10 ENCOUNTER — Other Ambulatory Visit: Payer: Self-pay | Admitting: Rheumatology

## 2019-05-10 NOTE — Telephone Encounter (Signed)
Last Visit: 03/17/19 Next Visit: 09/15/19  Okay to refill per Dr. Estanislado Pandy

## 2019-06-05 ENCOUNTER — Other Ambulatory Visit: Payer: Self-pay | Admitting: Rheumatology

## 2019-06-07 NOTE — Telephone Encounter (Signed)
Last Visit: 03/17/19 Next Visit: 09/15/19  Okay to refill per Dr. Estanislado Pandy

## 2019-06-14 ENCOUNTER — Other Ambulatory Visit: Payer: Self-pay | Admitting: Neurology

## 2019-06-14 ENCOUNTER — Telehealth: Payer: Self-pay | Admitting: Neurology

## 2019-06-14 ENCOUNTER — Encounter: Payer: Self-pay | Admitting: *Deleted

## 2019-06-14 NOTE — Telephone Encounter (Signed)
Pt called in and stated that she has the Flu and she is having severe migraines and the propranolol (INDERAL) 10 MG tablet isnt working and she wants to know if she can get started back on eletriptan (RELPAX) 40 MG tablet

## 2019-06-14 NOTE — Telephone Encounter (Signed)
I spoke with the pt. She stated she has "never never" had a reaction to eletriptan aka relpax. She said she has tried many medications and that's the only thing that really works. The 40 mg will usually take the migraine away within 1 hr. She has taken it with phenergan and benadryl. She said the Imitrex made her feel like she was having heart attack but was very clear that she does not have a relpax allergy. I took this off her allergy list and made a note. She verbalized appreciation for the call and is aware Dr. Jaynee Eagles will get this message.

## 2019-06-14 NOTE — Telephone Encounter (Signed)
It says she had anaphylaxis with Eletriptan in her allergies. Tell her I will send her an email.

## 2019-06-15 ENCOUNTER — Other Ambulatory Visit: Payer: Self-pay | Admitting: Neurology

## 2019-06-15 MED ORDER — ELETRIPTAN HYDROBROMIDE 40 MG PO TABS: 40.0000 mg | ORAL_TABLET | ORAL | 11 refills | Status: AC | PRN

## 2019-06-15 NOTE — Telephone Encounter (Signed)
Ordered, done.

## 2019-06-16 NOTE — Telephone Encounter (Signed)
Spoke with pt. She is aware Eletriptan 40 mg was sent to [facility]. Pt understands the instructions. I advised her to avoid taking this more than 2 days per week d/t risk for rebound headaches. Pt verbalized appreciation.

## 2019-06-28 NOTE — Progress Notes (Signed)
Consent Form Botulism Toxin Injection For Chronic Migraine   Interval history 06/29/2019: This is absolutely hilarious 53 year old Cindy Nunez who is here for her third botox. She has only had one migraine since last treatment. Excellent. 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  Her baseline is 20-25 migraines a month. When she was on Botox in the past he frequency was reduced to 2 migraines a month. Re-initiated in April. She did really well on botox. Reduced to 2 migraine days a month July, +a.She has pain in the levators/romboids will also place some there.   Reviewed orally with patient, additionally signature is on file:  Botulism toxin has been approved by the Federal drug administration for treatment of chronic migraine. Botulism toxin does not cure chronic migraine and it may not be effective in some patients.  The administration of botulism toxin is accomplished by injecting a small amount of toxin into the muscles of the neck and head. Dosage must be titrated for each individual. Any benefits resulting from botulism toxin tend to wear off after 3 months with a repeat injection required if benefit is to be maintained. Injections are usually done every 3-4 months with maximum effect peak achieved by about 2 or 3 weeks. Botulism toxin is expensive and you should be sure of what costs you will incur resulting from the injection.  The side effects of botulism toxin use for chronic migraine may include:   -Transient, and usually mild, facial weakness with facial injections  -Transient, and usually mild, head or neck weakness with head/neck injections  -Reduction or loss of forehead facial animation due to forehead muscle weakness  -Eyelid drooping  -Dry eye  -Pain at the site of injection or bruising at the site of injection  -Double vision  -Potential unknown long term risks  Contraindications: You should not have Botox if you are  pregnant, nursing, allergic to albumin, have an infection, skin condition, or muscle weakness at the site of the injection, or have myasthenia gravis, Lambert-Eaton syndrome, or ALS.  It is also possible that as with any injection, there may be an allergic reaction or no effect from the medication. Reduced effectiveness after repeated injections is sometimes seen and rarely infection at the injection site may occur. All care will be taken to prevent these side effects. If therapy is given over a long time, atrophy and wasting in the muscle injected may occur. Occasionally the patient's become refractory to treatment because they develop antibodies to the toxin. In this event, therapy needs to be modified.  I have read the above information and consent to the administration of botulism toxin.    BOTOX PROCEDURE NOTE FOR MIGRAINE HEADACHE    Contraindications and precautions discussed with patient(above). Aseptic procedure was observed and patient tolerated procedure. Procedure performed by Dr. Georgia Dom  The condition has existed for more than 6 months, and pt does not have a diagnosis of ALS, Myasthenia Gravis or Lambert-Eaton Syndrome.  Risks and benefits of injections discussed and pt agrees to proceed with the procedure.  Written consent obtained  These injections are medically necessary. Pt  receives good benefits from these injections. These injections do not cause sedations or hallucinations which the oral therapies may cause.  Description of procedure:  The patient was placed in a sitting position. The standard protocol was used for Botox as follows, with 5 units of Botox injected at each site:   -Procerus muscle,  midline injection  -Corrugator muscle, bilateral injection  -Frontalis muscle, bilateral injection, with 2 sites each side, medial injection was performed in the upper one third of the frontalis muscle, in the region vertical from the medial inferior edge of the superior  orbital rim. The lateral injection was again in the upper one third of the forehead vertically above the lateral limbus of the cornea, 1.5 cm lateral to the medial injection site.  -Temporalis muscle injection, 4 sites, bilaterally. The first injection was 3 cm above the tragus of the ear, second injection site was 1.5 cm to 3 cm up from the first injection site in line with the tragus of the ear. The third injection site was 1.5-3 cm forward between the first 2 injection sites. The fourth injection site was 1.5 cm posterior to the second injection site.  - Patient feels her clenching is a trigger for headaches. +5 units masseter bilaterally   - Levator Scapula +10 Units each  -Occipitalis muscle injection, 3 sites, bilaterally. The first injection was done one half way between the occipital protuberance and the tip of the mastoid process behind the ear. The second injection site was done lateral and superior to the first, 1 fingerbreadth from the first injection. The third injection site was 1 fingerbreadth superiorly and medially from the first injection site.  -Cervical paraspinal muscle injection, 2 sites, bilateral knee first injection site was 1 cm from the midline of the cervical spine, 3 cm inferior to the lower border of the occipital protuberance. The second injection site was 1.5 cm superiorly and laterally to the first injection site.  -Trapezius muscle injection was performed at 3 sites, bilaterally. The first injection site was in the upper trapezius muscle halfway between the inflection point of the neck, and the acromion. The second injection site was one half way between the acromion and the first injection site. The third injection was done between the first injection site and the inflection point of the neck.   Will return for repeat injection in 3 months.   A 200 unit sof Botox was used, any Botox not injected was wasted. The patient tolerated the procedure well, there were no  complications of the above procedure.

## 2019-06-29 ENCOUNTER — Other Ambulatory Visit: Payer: Self-pay

## 2019-06-29 ENCOUNTER — Ambulatory Visit: Payer: Medicare Other | Admitting: Neurology

## 2019-06-29 VITALS — Temp 97.3°F

## 2019-06-29 DIAGNOSIS — G43711 Chronic migraine without aura, intractable, with status migrainosus: Secondary | ICD-10-CM

## 2019-06-29 NOTE — Progress Notes (Signed)
Botox- 200 units x 1 vial Lot: [id number] Expiration: 03/2022 NDC: 0023-3921-02  Bacteriostatic 0.9% Sodium Chloride- 4mL total Lot: [id number] Expiration: 11/11/2019 NDC: 0409-1966-02  Dx: G43.711 B/B   

## 2019-06-29 NOTE — Telephone Encounter (Signed)
Pt here for botox today. States she stopped the propranolol a week and a half ago because it wasn't working. She couldn't remember the original start date. I called [facility] and spoke with Merry Proud. He stated the pt picked up the propranolol originally on 04/18/2019.

## 2019-07-21 ENCOUNTER — Other Ambulatory Visit: Payer: Self-pay | Admitting: Rheumatology

## 2019-07-21 NOTE — Telephone Encounter (Signed)
Last Visit: 03/17/2019  Next Visit: 09/15/2019  Okay to refill per Dr. Estanislado Pandy.

## 2019-09-07 NOTE — Progress Notes (Signed)
Virtual Visit via Video Note  I connected with Cindy Nunez on 09/15/19 at  8:45 AM EST by a video enabled telemedicine application and verified that I am speaking with the correct person using two identifiers.  Location: Patient: Home Provider: Clinic  This service was conducted via virtual visit.  Both audio and visual tools were used.  The patient was located at home. I was located in my office.  Consent was obtained prior to the virtual visit and is aware of possible charges through their insurance for this visit.  The patient is an established patient.  Dr. Estanislado Pandy, MD conducted the virtual visit and Hazel Sams, PA-C acted as scribe during the service.  Office staff helped with scheduling follow up visits after the service was conducted.     I discussed the limitations of evaluation and management by telemedicine and the availability of in person appointments. The patient expressed understanding and agreed to proceed.  CC: Trochanteric bursitis bilaterally  History of Present Illness: Patient is a 54 year old female with a past medical history of fibromyalgia and DDD. She is taking cymbalta 60 mg 1 capsule by mouth daily, Lyrica 150 mg TID, flexeril 10 mg po at bedtime, and Robaxin 750 mg BID for muscle spasms.  She states her fibromyalgia pain has been manageable recently.  Her current treatment regimen has been controlling her symptoms.  She continues to have trochanteric bursitis bilaterally.  She states the cortisone injections on 03/17/19 were helpful but her discomfort has returned.  She denies any other joint pain or joint swelling at this time.     Review of Systems  Constitutional: Positive for malaise/fatigue. Negative for fever.  HENT: Negative for congestion.   Eyes: Negative for photophobia, pain, discharge and redness.  Respiratory: Negative for cough and wheezing.   Cardiovascular: Negative for chest pain and palpitations.  Gastrointestinal: Positive for constipation.  Negative for blood in stool and diarrhea.  Genitourinary: Negative for dysuria and urgency.  Musculoskeletal: Positive for joint pain. Negative for back pain, myalgias and neck pain.  Skin: Negative for rash.  Neurological: Negative for dizziness and headaches.  Psychiatric/Behavioral: Negative for depression. The patient is not nervous/anxious and does not have insomnia.       Observations/Objective: Physical Exam  Constitutional: She is oriented to person, place, and time and well-developed, well-nourished, and in no distress.  HENT:  Head: Normocephalic and atraumatic.  Eyes: Conjunctivae are normal.  Pulmonary/Chest: Effort normal.  Neurological: She is alert and oriented to person, place, and time.  Psychiatric: Mood, memory, affect and judgment normal.   Patient reports morning stiffness for 1 hour.   Patient denies nocturnal pain.  Difficulty dressing/grooming: Denies Difficulty climbing stairs: Denies Difficulty getting out of chair: Denies Difficulty using hands for taps, buttons, cutlery, and/or writing: Reports   Assessment and Plan: Visit Diagnoses: Fibromyalgia - Her fibromyalgia pain has been manageable recently. Her symptoms have been manageable while taking Lyrica 150 mg 3 times daily and Cymbalta 60 mg 1 capsule by mouth daily.  She takes Robaxin 750 mg twice daily and Flexeril 10 mg by mouth at bedtime for muscle spasms.  We discussed the importance of trying to reduce Flexeril to 5 mg at bedtime.  She continues to have trochanteric bursitis bilaterally. She had cortisone injections on 03/17/19, which were effective but her discomfort has returned. She was encouraged to perform stretching exercises daily. She has chronic fatigue related to insomnia.  Good sleep hygiene was discussed. She will follow up in  5-6 months.  She will have updated lab work forwarded to use.  Other fatigue - She has chronic fatigue related to insomnia.  She has been exercising on a regular  basis.  Primary insomnia - She takes Flexeril 10 mg 1 tablet po at bedtime as needed for muscle spasms and insomnia.   Trochanteric bursitis of both hips - She has persistent discomfort related to trochanteric bursitis bilaterally.  She experiences nocturnal pain when laying on her sides at night.  She had cortisone injections at last visit on 03/17/19.  She was encouraged to perform stretching exercises daily.  DDD (degenerative disc disease), lumbar - S/p fusion-Chronic pain  DDD (degenerative disc disease), thoracic - Chronic pain   Chronic pain syndrome -She takes Lyrica 150 mg by mouth TID and Cymbalta 60 mg 1 capsule by mouth daily.   Osteopenia of multiple sites - DEXAs ordered by PCP.  She was advised to check with her PCP when her next DEXA is due.  History of migraine: She has been seeing Dr. Jaynee Eagles for Botox injections every 3 months.  Her most recent injection was on 06/29/19.   Other medical conditions are listed as follows:  History of obsessive compulsive disorder  History of vitamin D deficiency  History of gastric bypass   History of anxiety  History of vertebral fracture   History of leukemia   Follow Up Instructions: She will follow up in 5-6 months.    I discussed the assessment and treatment plan with the patient. The patient was provided an opportunity to ask questions and all were answered. The patient agreed with the plan and demonstrated an understanding of the instructions.   The patient was advised to call back or seek an in-person evaluation if the symptoms worsen or if the condition fails to improve as anticipated.  I provided 15 minutes of non-face-to-face time during this encounter.   Bo Merino, MD   Scribed byLovena Le Dale,PA-C

## 2019-09-08 ENCOUNTER — Telehealth: Payer: Self-pay | Admitting: Neurology

## 2019-09-08 NOTE — Telephone Encounter (Signed)
Pt has called to report a different type of headache.  Pt states on yesterday the headache involved her neck.  Pt felt like a disc was slipping.  Pt states she was in bed all day yesterday.  Pt states she feels much better today, she would like a call from RN to discuss

## 2019-09-08 NOTE — Telephone Encounter (Signed)
Spoke with pt. She has some neck tightness today. She stated the headache she had yesterday was like the headaches she had over 17 years ago that led to her getting treatment. She was told they were migraines. She said the pain was severe on the left where she always gets her migraines however she couldn't lay on her left side because of the pain. Her 1st and 2nd vertebrae feel like they are "slipping out of place". She said there was pain in the front of her head but it didn't feel like it was "going all the way through". She doesn't want another headache like this. I advised pt if she feels a headache/migraine coming on she can take Relpax. However I stressed to the pt and she verbalized understanding that if she develops a new headache, worst headache of her life and/or has any symptoms such as numbness, tingling, weakness, slurred speech, any stroke-like symptoms she needs emergency hospital treatment. Pt said if the neck issue continues she will need to be seen. She has never been treated for any neck issues, just lower back (Dr. Lorin Mercy). I encouraged the pt to call us back if needed. Pt aware we have an on-call MD available after hours if needed. She verbalized appreciation for the call.

## 2019-09-15 ENCOUNTER — Encounter: Payer: Self-pay | Admitting: Rheumatology

## 2019-09-15 ENCOUNTER — Other Ambulatory Visit: Payer: Self-pay

## 2019-09-15 ENCOUNTER — Telehealth: Payer: Self-pay | Admitting: Rheumatology

## 2019-09-15 ENCOUNTER — Telehealth (INDEPENDENT_AMBULATORY_CARE_PROVIDER_SITE_OTHER): Payer: Medicare Other | Admitting: Rheumatology

## 2019-09-15 DIAGNOSIS — M5134 Other intervertebral disc degeneration, thoracic region: Secondary | ICD-10-CM

## 2019-09-15 DIAGNOSIS — Z8669 Personal history of other diseases of the nervous system and sense organs: Secondary | ICD-10-CM

## 2019-09-15 DIAGNOSIS — R5383 Other fatigue: Secondary | ICD-10-CM

## 2019-09-15 DIAGNOSIS — Z8659 Personal history of other mental and behavioral disorders: Secondary | ICD-10-CM

## 2019-09-15 DIAGNOSIS — Z8781 Personal history of (healed) traumatic fracture: Secondary | ICD-10-CM

## 2019-09-15 DIAGNOSIS — M8589 Other specified disorders of bone density and structure, multiple sites: Secondary | ICD-10-CM

## 2019-09-15 DIAGNOSIS — M7061 Trochanteric bursitis, right hip: Secondary | ICD-10-CM | POA: Diagnosis not present

## 2019-09-15 DIAGNOSIS — Z8639 Personal history of other endocrine, nutritional and metabolic disease: Secondary | ICD-10-CM

## 2019-09-15 DIAGNOSIS — F5101 Primary insomnia: Secondary | ICD-10-CM | POA: Diagnosis not present

## 2019-09-15 DIAGNOSIS — M797 Fibromyalgia: Secondary | ICD-10-CM | POA: Diagnosis not present

## 2019-09-15 DIAGNOSIS — M7062 Trochanteric bursitis, left hip: Secondary | ICD-10-CM

## 2019-09-15 DIAGNOSIS — Z9884 Bariatric surgery status: Secondary | ICD-10-CM

## 2019-09-15 DIAGNOSIS — M5136 Other intervertebral disc degeneration, lumbar region: Secondary | ICD-10-CM

## 2019-09-15 DIAGNOSIS — Z856 Personal history of leukemia: Secondary | ICD-10-CM

## 2019-09-15 DIAGNOSIS — G894 Chronic pain syndrome: Secondary | ICD-10-CM

## 2019-09-15 NOTE — Telephone Encounter (Signed)
Patient left a voicemail stating she had a virtual appointment this morning with Dr. Estanislado Pandy and she could not hear everything she was saying.  Patient is asking if someone could please call her back and let her know "exactly" what she said.

## 2019-09-15 NOTE — Telephone Encounter (Signed)
I returned patient's call.  She states the reception was not good and she could not hear all the words.  I went over the plan with her again to obtain CBC and CMP with her next labs or have it forwarded to [place] if it is already available.  We also discussed tapering of Flexeril if possible.  She had no other questions.

## 2019-09-23 ENCOUNTER — Other Ambulatory Visit: Payer: Self-pay | Admitting: Rheumatology

## 2019-09-23 MED ORDER — PREGABALIN 150 MG PO CAPS: ORAL_CAPSULE | ORAL | 1 refills | Status: DC

## 2019-09-23 MED ORDER — CYCLOBENZAPRINE HCL 10 MG PO TABS: 10.0000 mg | ORAL_TABLET | Freq: Every day | ORAL | 0 refills | Status: DC

## 2019-09-23 NOTE — Telephone Encounter (Signed)
Patient called requesting prescription refills of Flexeril, Methocarbamol, and Lyrica to be sent to [place] at [facility].

## 2019-09-23 NOTE — Telephone Encounter (Signed)
Last Visit: 09/15/19 Next Visit: 03/14/20  Too soon to refill Methocarbamol   Okay to refill Flexeril and Lyrica?

## 2019-09-23 NOTE — Telephone Encounter (Signed)
Ok to refill Flexeril and Lyrica

## 2019-10-07 ENCOUNTER — Other Ambulatory Visit: Payer: Self-pay

## 2019-10-07 ENCOUNTER — Telehealth: Payer: Self-pay | Admitting: Rheumatology

## 2019-10-07 ENCOUNTER — Ambulatory Visit: Payer: Medicare Other | Admitting: Neurology

## 2019-10-07 DIAGNOSIS — G43711 Chronic migraine without aura, intractable, with status migrainosus: Secondary | ICD-10-CM | POA: Diagnosis not present

## 2019-10-07 NOTE — Telephone Encounter (Signed)
Patient left a voicemail requesting a return call with her bone density test results.

## 2019-10-07 NOTE — Telephone Encounter (Signed)
Patient advised we have not received her bone density scan results. Patient states she will call to have them sent over so we can have Dr. Estanislado Pandy review them and then review them with her.

## 2019-10-07 NOTE — Progress Notes (Signed)
Botox- 200 units x 1 vial  Lot: [id number] Expiration: 05/2022 NDC: [id number]  Bacteriostatic 0.9% Sodium Chloride- 18mL total Lot: [id number] Expiration: 11/11/2019 NDC: [id number]  Dx: [id number] B/B

## 2019-10-07 NOTE — Progress Notes (Signed)
Consent Form Botulism Toxin Injection For Chronic Migraine   Interval history 10/07/2019 : One migraine in 3 months again, fantastic, she says a "miracle".  This is absolutely hilarious 54 year old lovelypatient who is here for her fourth botox. She has only had one migraine since last treatment. Excellent. 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  Her baseline is 20-25 migraines a month. When she was on Botox in the past he frequency was reduced to 2 migraines a month. Re-initiated in April. She did really well on botox. Reduced to 2 migraine days a month July, +a.She has pain in the levators/romboids will also place some there.   Reviewed orally with patient, additionally signature is on file:  Botulism toxin has been approved by the Federal drug administration for treatment of chronic migraine. Botulism toxin does not cure chronic migraine and it may not be effective in some patients.  The administration of botulism toxin is accomplished by injecting a small amount of toxin into the muscles of the neck and head. Dosage must be titrated for each individual. Any benefits resulting from botulism toxin tend to wear off after 3 months with a repeat injection required if benefit is to be maintained. Injections are usually done every 3-4 months with maximum effect peak achieved by about 2 or 3 weeks. Botulism toxin is expensive and you should be sure of what costs you will incur resulting from the injection.  The side effects of botulism toxin use for chronic migraine may include:   -Transient, and usually mild, facial weakness with facial injections  -Transient, and usually mild, head or neck weakness with head/neck injections  -Reduction or loss of forehead facial animation due to forehead muscle weakness  -Eyelid drooping  -Dry eye  -Pain at the site of injection or bruising at the site of injection  -Double vision  -Potential unknown long term  risks  Contraindications: You should not have Botox if you are pregnant, nursing, allergic to albumin, have an infection, skin condition, or muscle weakness at the site of the injection, or have myasthenia gravis, Lambert-Eaton syndrome, or ALS.  It is also possible that as with any injection, there may be an allergic reaction or no effect from the medication. Reduced effectiveness after repeated injections is sometimes seen and rarely infection at the injection site may occur. All care will be taken to prevent these side effects. If therapy is given over a long time, atrophy and wasting in the muscle injected may occur. Occasionally the patient's become refractory to treatment because they develop antibodies to the toxin. In this event, therapy needs to be modified.  I have read the above information and consent to the administration of botulism toxin.    BOTOX PROCEDURE NOTE FOR MIGRAINE HEADACHE    Contraindications and precautions discussed with patient(above). Aseptic procedure was observed and patient tolerated procedure. Procedure performed by Dr. Georgia Dom  The condition has existed for more than 6 months, and pt does not have a diagnosis of ALS, Myasthenia Gravis or Lambert-Eaton Syndrome.  Risks and benefits of injections discussed and pt agrees to proceed with the procedure.  Written consent obtained  These injections are medically necessary. Pt  receives good benefits from these injections. These injections do not cause sedations or hallucinations which the oral therapies may cause.  Description of procedure:  The patient was placed in a sitting position. The standard protocol was used for Botox as follows,  with 5 units of Botox injected at each site:   -Procerus muscle, midline injection  -Corrugator muscle, bilateral injection  -Frontalis muscle, bilateral injection, with 2 sites each side, medial injection was performed in the upper one third of the frontalis muscle, in  the region vertical from the medial inferior edge of the superior orbital rim. The lateral injection was again in the upper one third of the forehead vertically above the lateral limbus of the cornea, 1.5 cm lateral to the medial injection site.  -Temporalis muscle injection, 4 sites, bilaterally. The first injection was 3 cm above the tragus of the ear, second injection site was 1.5 cm to 3 cm up from the first injection site in line with the tragus of the ear. The third injection site was 1.5-3 cm forward between the first 2 injection sites. The fourth injection site was 1.5 cm posterior to the second injection site.  - Patient feels her clenching is a trigger for headaches. +5 units masseter bilaterally   - Levator Scapula +10 Units each  -Occipitalis muscle injection, 3 sites, bilaterally. The first injection was done one half way between the occipital protuberance and the tip of the mastoid process behind the ear. The second injection site was done lateral and superior to the first, 1 fingerbreadth from the first injection. The third injection site was 1 fingerbreadth superiorly and medially from the first injection site.  -Cervical paraspinal muscle injection, 2 sites, bilateral knee first injection site was 1 cm from the midline of the cervical spine, 3 cm inferior to the lower border of the occipital protuberance. The second injection site was 1.5 cm superiorly and laterally to the first injection site.  -Trapezius muscle injection was performed at 3 sites, bilaterally. The first injection site was in the upper trapezius muscle halfway between the inflection point of the neck, and the acromion. The second injection site was one half way between the acromion and the first injection site. The third injection was done between the first injection site and the inflection point of the neck.   Will return for repeat injection in 3 months.   A 200 unit sof Botox was used, any Botox not injected was  wasted. The patient tolerated the procedure well, there were no complications of the above procedure.

## 2019-10-14 ENCOUNTER — Other Ambulatory Visit: Payer: Self-pay | Admitting: Rheumatology

## 2019-10-14 NOTE — Telephone Encounter (Signed)
Last Visit: 09/15/19 Next Visit: 03/14/20  Okay to refill per Dr. Estanislado Pandy

## 2019-10-22 ENCOUNTER — Telehealth: Payer: Self-pay | Admitting: Rheumatology

## 2019-10-22 NOTE — Telephone Encounter (Signed)
Patient advised we have not received the results. Patient will call [facility] and have the results sent.

## 2019-10-22 NOTE — Telephone Encounter (Signed)
Patient had a Bone Density done a couple of weeks ago at [facility]. Patient has not heard anything, and was checking on that. Please call to advise.

## 2019-10-26 ENCOUNTER — Telehealth: Payer: Self-pay | Admitting: Rheumatology

## 2019-10-26 MED ORDER — CYCLOBENZAPRINE HCL 10 MG PO TABS: 10.0000 mg | ORAL_TABLET | Freq: Every day | ORAL | 0 refills | Status: DC

## 2019-10-26 NOTE — Telephone Encounter (Signed)
Patient request refill on Flexeril sent to [place] [place] at [facility].

## 2019-10-26 NOTE — Telephone Encounter (Signed)
Last Visit: 09/15/19 Next Visit: 03/14/20  Okay to refill per Dr. Estanislado Pandy

## 2019-11-21 ENCOUNTER — Other Ambulatory Visit: Payer: Self-pay | Admitting: Rheumatology

## 2019-11-22 ENCOUNTER — Telehealth: Payer: Self-pay | Admitting: Rheumatology

## 2019-11-22 NOTE — Telephone Encounter (Signed)
Patient called requesting prescription refill of Flexeril to be sent to [place] at [facility].  Patient is asking if it is possible to have a 3 month supply and/or additional refills.

## 2019-11-22 NOTE — Telephone Encounter (Signed)
Patient advised prescription had already been sent to the pharmacy prior to receiving this message. Patient advised will ask Dr. Estanislado Pandy for a 90 day supply with next refill. Patient was in agreement.

## 2019-11-22 NOTE — Telephone Encounter (Signed)
Last Visit: 09/15/19 Next Visit: 03/14/20  Okay to refill per Dr. Estanislado Pandy

## 2019-12-20 ENCOUNTER — Telehealth: Payer: Self-pay | Admitting: *Deleted

## 2019-12-20 NOTE — Telephone Encounter (Signed)
Patient has a Botox appointment on 01/04/2020.  I called [employer] [phone] and spoke to [place].  She states that [id number] and [phone] are valid and billable. The do not require PA. Ref# for call is [id number].

## 2020-01-03 NOTE — Progress Notes (Signed)
Consent Form Botulism Toxin Injection For Chronic Migraine   Interval history 01/04/2020: ZERO migraine in 3 months again, fantastic, she says a "miracle".  This is absolutely hilarious 54 year old Cindy Nunez who is here for her 5th botox. She has 0 migraine since last treatment. Excellent. 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  Her baseline is 20-25 migraines a month.  +a.She has pain in the levators/romboids will also place some there. sHE ALSO HAS A SPOCK ON THE LEFT, PUT 2 UNITS THERE MAY NEED MORE.  Reviewed orally with patient, additionally signature is on file:  Botulism toxin has been approved by the Federal drug administration for treatment of chronic migraine. Botulism toxin does not cure chronic migraine and it may not be effective in some patients.  The administration of botulism toxin is accomplished by injecting a small amount of toxin into the muscles of the neck and head. Dosage must be titrated for each individual. Any benefits resulting from botulism toxin tend to wear off after 3 months with a repeat injection required if benefit is to be maintained. Injections are usually done every 3-4 months with maximum effect peak achieved by about 2 or 3 weeks. Botulism toxin is expensive and you should be sure of what costs you will incur resulting from the injection.  The side effects of botulism toxin use for chronic migraine may include:   -Transient, and usually mild, facial weakness with facial injections  -Transient, and usually mild, head or neck weakness with head/neck injections  -Reduction or loss of forehead facial animation due to forehead muscle weakness  -Eyelid drooping  -Dry eye  -Pain at the site of injection or bruising at the site of injection  -Double vision  -Potential unknown long term risks  Contraindications: You should not have Botox if you are pregnant, nursing, allergic to albumin, have an infection, skin  condition, or muscle weakness at the site of the injection, or have myasthenia gravis, Lambert-Eaton syndrome, or ALS.  It is also possible that as with any injection, there may be an allergic reaction or no effect from the medication. Reduced effectiveness after repeated injections is sometimes seen and rarely infection at the injection site may occur. All care will be taken to prevent these side effects. If therapy is given over a long time, atrophy and wasting in the muscle injected may occur. Occasionally the patient's become refractory to treatment because they develop antibodies to the toxin. In this event, therapy needs to be modified.  I have read the above information and consent to the administration of botulism toxin.    BOTOX PROCEDURE NOTE FOR MIGRAINE HEADACHE    Contraindications and precautions discussed with patient(above). Aseptic procedure was observed and patient tolerated procedure. Procedure performed by Dr. Georgia Dom  The condition has existed for more than 6 months, and pt does not have a diagnosis of ALS, Myasthenia Gravis or Lambert-Eaton Syndrome.  Risks and benefits of injections discussed and pt agrees to proceed with the procedure.  Written consent obtained  These injections are medically necessary. Pt  receives good benefits from these injections. These injections do not cause sedations or hallucinations which the oral therapies may cause.  Description of procedure:  The patient was placed in a sitting position. The standard protocol was used for Botox as follows, with 5 units of Botox injected at each site:   -Procerus muscle, midline injection  -Corrugator muscle, bilateral injection  -Frontalis  muscle, bilateral injection, with 2 sites each side, medial injection was performed in the upper one third of the frontalis muscle, in the region vertical from the medial inferior edge of the superior orbital rim. The lateral injection was again in the upper one  third of the forehead vertically above the lateral limbus of the cornea, 1.5 cm lateral to the medial injection site.  -Temporalis muscle injection, 4 sites, bilaterally. The first injection was 3 cm above the tragus of the ear, second injection site was 1.5 cm to 3 cm up from the first injection site in line with the tragus of the ear. The third injection site was 1.5-3 cm forward between the first 2 injection sites. The fourth injection site was 1.5 cm posterior to the second injection site.  - Patient feels her clenching is a trigger for headaches. +5 units masseter bilaterally   - Levator Scapula +10 Units each  -Occipitalis muscle injection, 3 sites, bilaterally. The first injection was done one half way between the occipital protuberance and the tip of the mastoid process behind the ear. The second injection site was done lateral and superior to the first, 1 fingerbreadth from the first injection. The third injection site was 1 fingerbreadth superiorly and medially from the first injection site.  -Cervical paraspinal muscle injection, 2 sites, bilateral knee first injection site was 1 cm from the midline of the cervical spine, 3 cm inferior to the lower border of the occipital protuberance. The second injection site was 1.5 cm superiorly and laterally to the first injection site.  -Trapezius muscle injection was performed at 3 sites, bilaterally. The first injection site was in the upper trapezius muscle halfway between the inflection point of the neck, and the acromion. The second injection site was one half way between the acromion and the first injection site. The third injection was done between the first injection site and the inflection point of the neck.   Will return for repeat injection in 3 months.   A 200 unit sof Botox was used, any Botox not injected was wasted. The patient tolerated the procedure well, there were no complications of the above procedure.

## 2020-01-04 ENCOUNTER — Other Ambulatory Visit: Payer: Self-pay

## 2020-01-04 ENCOUNTER — Ambulatory Visit: Payer: Medicare Other | Admitting: Neurology

## 2020-01-04 DIAGNOSIS — G43711 Chronic migraine without aura, intractable, with status migrainosus: Secondary | ICD-10-CM | POA: Diagnosis not present

## 2020-01-04 NOTE — Progress Notes (Signed)
Botox- 200 units x 1 vial Lot: [id number] Expiration: 09/2022 NDC: 0023-3921-02  Bacteriostatic 0.9% Sodium Chloride- 4mL total Lot: [id number] Expiration: 02/10/2020 NDC: 0409-1966-02  Dx: G43.711 B/B  

## 2020-01-27 ENCOUNTER — Other Ambulatory Visit: Payer: Self-pay | Admitting: Rheumatology

## 2020-01-27 MED ORDER — CYCLOBENZAPRINE HCL 10 MG PO TABS: 10.0000 mg | ORAL_TABLET | Freq: Every day | ORAL | 0 refills | Status: DC

## 2020-01-27 MED ORDER — METHOCARBAMOL 750 MG PO TABS: 750.0000 mg | ORAL_TABLET | Freq: Two times a day (BID) | ORAL | 0 refills | Status: DC

## 2020-01-27 NOTE — Telephone Encounter (Signed)
Patient called requesting prescription refills for Cyclobenzaprine 10 mg tablet and Methocarbamol 750 mg tablet to be sent to [place] at [facility].  Patient is requesting a 3 month supply so she doesn't have to request a refill every month.

## 2020-01-27 NOTE — Telephone Encounter (Signed)
Last Visit: 09/15/19 Next Visit: 03/14/20  Patient requesting 90 day supply on both medications  Last Fill: 10/14/2019 Methocarbamol(90 day supply), 11/22/2019 Flexeril (30 day supply)  Okay to refill Methocarbamol and Cyclobenzaprine?

## 2020-02-21 ENCOUNTER — Telehealth: Payer: Self-pay

## 2020-02-21 NOTE — Telephone Encounter (Signed)
Pt called office and left a VM advising [place] that she is having back, neck, and hip pain. She is wondering if Dr. Jaynee Eagles can help her with this.

## 2020-02-21 NOTE — Telephone Encounter (Signed)
Spoke with Dr. Jaynee Eagles. Recommend pt see orthopedics for this pain. If neurology needed we need a new referral but highly recommend orthopedics. I spoke with the pt and let her know. She verbalized understanding and appreciation for the call. She will see orthopedics again tomorrow.

## 2020-02-23 ENCOUNTER — Telehealth: Payer: Self-pay | Admitting: Rheumatology

## 2020-02-23 MED ORDER — CYCLOBENZAPRINE HCL 10 MG PO TABS: 10.0000 mg | ORAL_TABLET | Freq: Every day | ORAL | 0 refills | Status: DC

## 2020-02-23 NOTE — Telephone Encounter (Signed)
Last Visit: 09/15/19 Next Visit: 03/14/20  Last Fill 01/27/2020  Okay to refill Flexeril?

## 2020-02-23 NOTE — Telephone Encounter (Signed)
Patient called requesting prescription refill of Flexeril to be sent to [place] at [facility].

## 2020-02-29 NOTE — Progress Notes (Signed)
Office Visit Note  Patient: Cindy Nunez             Date of Birth: 1966-04-29           MRN: [id number]             PCP: Myrlene Broker, MD Referring: Myrlene Broker, MD Visit Date: 03/14/2020 Occupation: '@GUAROCC' @  Subjective:  Other (patient had back injury while moving. [place] )   History of Present Illness: Cindy Nunez is a 54 y.o. female with history of fibromyalgia and osteoarthritis.  She states she has been in the moving process.  She sold her home and is moving to [facility].  She has been moving heavy boxes.  She has been having a lot of pain and stiffness in her neck and lower back.  She seen a back a specialist.  Her trochanteric bursa are not as painful.  She is also having a flare of fibromyalgia due to extra stress on her body.  Activities of Daily Living:  Patient reports morning stiffness for  1 hour.   Patient Reports nocturnal pain.  Difficulty dressing/grooming: Denies Difficulty climbing stairs: Denies Difficulty getting out of chair: Denies Difficulty using hands for taps, buttons, cutlery, and/or writing: Reports  Review of Systems  Constitutional: Positive for fatigue.  HENT: Positive for mouth dryness. Negative for mouth sores and nose dryness.   Eyes: Positive for dryness. Negative for itching.  Respiratory: Negative for shortness of breath and difficulty breathing.   Cardiovascular: Negative for chest pain and palpitations.  Gastrointestinal: Negative for blood in stool, constipation and diarrhea.  Endocrine: Negative for increased urination.  Genitourinary: Negative for difficulty urinating.  Musculoskeletal: Positive for arthralgias, joint pain, myalgias, morning stiffness, muscle tenderness and myalgias. Negative for joint swelling.  Skin: Negative for color change, rash and redness.  Allergic/Immunologic: Negative for susceptible to infections.  Neurological: Positive for numbness and headaches.  Negative for dizziness, memory loss and weakness.  Hematological: Positive for bruising/bleeding tendency.  Psychiatric/Behavioral: Negative for confusion.    PMFS History:  Patient Active Problem List   Diagnosis Date Noted  . Chronic migraine without aura, with intractable migraine, so stated, with status migrainosus 11/16/2018  . DDD (degenerative disc disease), 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  . History of migraine 02/07/2017  . Osteopenia  08/12/2016  . Obsessive-compulsive disorder 08/12/2016  . Anxiety 08/12/2016  . History of leukemia 08/08/2016  . Fibromyalgia 08/08/2016  . Chronic pain syndrome 08/08/2016  . 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  . History of vertebral fracture 08/08/2016  . Vitamin D deficiency 08/08/2016  . Other fatigue 08/08/2016  . Primary insomnia 08/08/2016  . 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 Arthritis   . Asthma   . Endometriosis 1989  . Hernia, umbilical 3086  . Thyroid disease     Family History  Problem Relation Age of Onset  . Cancer Father        Pancreatic-small cell carcinoma  . Cancer Paternal Aunt   . Cancer Paternal Uncle   . Cancer Paternal Grandmother   . Cancer Paternal Grandfather   . Osteoporosis Mother   . Fibromyalgia Mother   . Osteoarthritis Mother   . Migraines Mother   . Migraines Sister   . Migraines  Brother   . Migraines Cousin        several cousins  . Migraines Son   . Migraines Son    Past Surgical History:  Procedure Laterality Date  . ABDOMINAL HYSTERECTOMY  2011   Partial  . ANTERIOR AND POSTERIOR VAGINAL REPAIR  05/2010  . ANTERIOR AND POSTERIOR VAGINAL REPAIR  2010  . APPENDECTOMY  1990  . BACK SURGERY  10/29/07   L4, L5  . BREAST LUMPECTOMY  2006   Left breast; benign  .  CESAREAN SECTION  1993  . CHOLECYSTECTOMY  1990  . HERNIA REPAIR  2011   Follow up  . HERNIA REPAIR  03/2009   Triple Hernia & Endomnioplasty (reported per pt)  . ROTATOR CUFF REPAIR  07/2011  . ROUX-EN-Y GASTRIC BYPASS  2007  . TONSILLECTOMY AND ADENOIDECTOMY  1979  . TOTAL SHOULDER ARTHROPLASTY    . TYMPANOSTOMY [place], 1978  . UMBILICAL HERNIA REPAIR  2001   Social History   Social History Narrative   Lives at home with her husband & dog   Right handed   Caffeine: none    Immunization History  Administered Date(s) Administered  . PFIZER SARS-COV-2 Vaccination 11/07/2019, 11/28/2019     Objective: Vital Signs: BP (!) 137/100 (BP Location: Right Arm, Patient Position: Sitting, Cuff Size: Normal)   Pulse (!) 104   Resp 15   Ht '5\' 6"'  (1.676 m)   Wt 206 lb 12.8 oz (93.8 kg)   BMI 33.38 kg/m    Physical Exam Vitals and nursing note reviewed.  Constitutional:      Appearance: She is well-developed.  HENT:     Head: Normocephalic and atraumatic.  Eyes:     Conjunctiva/sclera: Conjunctivae normal.  Cardiovascular:     Rate and Rhythm: Normal rate and regular rhythm.     Heart sounds: Normal heart sounds.  Pulmonary:     Effort: Pulmonary effort is normal.     Breath sounds: Normal breath sounds.  Abdominal:     General: Bowel sounds are normal.     Palpations: Abdomen is soft.  Musculoskeletal:     Cervical back: Normal range of motion.  Lymphadenopathy:     Cervical: No cervical adenopathy.  Skin:    General: Skin is warm and dry.     Capillary Refill: Capillary refill takes less than 2 seconds.  Neurological:     Mental Status: She is alert and oriented to person, place, and time.  Psychiatric:        Behavior: Behavior normal.      Musculoskeletal Exam: C-spine was in good range of motion.  She had bilateral trapezius spasm.  She has discomfort range of motion of lumbar spine.  Shoulder joints, elbow joints, wrist joints, MCPs PIPs and  DIPs with good range of motion with no synovitis.  Hip joints, knee joints, ankles, MTPs and PIPs with good range of motion with no synovitis.  She had tenderness over bilateral trochanteric bursa and SI joint region.  CDAI Exam: CDAI Score: -- Patient Global: --; Provider Global: -- Swollen: --; Tender: -- Joint Exam 03/14/2020   No joint exam has been documented for this visit   There is currently no information documented on the homunculus. Go to the Rheumatology activity and complete the homunculus joint exam.  Investigation: No additional findings.  Imaging: No results found.  Recent Labs: Lab Results  Component Value Date   WBC (LL) 11/01/2010    0.6 REPEATED TO  VERIFY CRITICAL RESULT CALLED TO, READ BACK BY AND VERIFIED WITH: BRYANT,T. RN AT 0805 ON 11/01/10 BY GILLESPIE,B. RARE NRBCs SEE BONE MARROW [id number]   HGB 10.7 (L) 11/01/2010   PLT (LL) 11/01/2010    22 REPEATED TO VERIFY PLATELET COUNT CONFIRMED BY SMEAR CRITICAL RESULT CALLED TO, READ BACK BY AND VERIFIED WITH: BRYANT,T. RN AT 0805 ON 11/01/10 BY GILLESPIE,B. SPECIMEN CHECKED FOR CLOTS   NA 142 10/29/2010   K 3.4 (L) 10/29/2010   CL 110 10/29/2010   CO2 19 10/29/2010   GLUCOSE 132 (H) 10/29/2010   BUN 24 (H) 10/29/2010   CREATININE 0.93 10/29/2010   BILITOT 0.3 10/29/2010   ALKPHOS 98 10/29/2010   AST 15 10/29/2010   ALT 17 10/29/2010   PROT 6.2 10/29/2010   ALBUMIN 3.9 10/29/2010   CALCIUM 8.8 10/29/2010   GFRAA  11/09/2007    >60        The eGFR has been calculated using the MDRD equation. This calculation has not been validated in all clinical    Speciality Comments: No specialty comments available.  Procedures:  No procedures performed Allergies: Asa [aspirin], Codeine, Penicillins, Toradol [ketorolac tromethamine], Imitrex [sumatriptan], and Milk-related compounds   Assessment / Plan:     Visit Diagnoses: Fibromyalgia -she is having a flare of fibromyalgia due to extra stress from moving  process.  She is on multiple medications to manage her symptoms.  lyrica, cymblata, robaxin, flexeril.  She is moving to [facility]  Have advised her to establish with a PCP there who can prescribe her medications.  She necessarily does not need a rheumatologist.  Primary insomnia - Flexeril 10 mg 1 tablet po at bedtime as needed for muscle spasms and insomnia.  Other fatigue-related to insomnia and fibromyalgia.  Trochanteric bursitis of both hips-she is having some IT band discomfort.  Handout on exercises was given.  DDD (degenerative disc disease), lumbar - S/p fusion.  She had bilateral trapezius spasm.  A handout on neck exercises was given.  DDD (degenerative disc disease), thoracic-she is experiencing thoracic and lumbar pain and SI joint pain.  A handout on back exercises was given.  Patient states she will be getting physical therapy soon.  Chronic pain syndrome -she has a lot of discomfort currently.  Lyrica 150 mg by mouth TID and Cymbalta 60 mg 1 capsule by mouth daily.   Osteopenia of multiple sites - DEXAs ordered by PCP  History of migraine - She has been seeing Dr. Jaynee Eagles for Botox injections every 3 months.   History of obsessive compulsive disorder  History of vitamin D deficiency  History of anxiety  History of vertebral fracture  History of leukemia  History of gastric bypass  Orders: No orders of the defined types were placed in this encounter.  No orders of the defined types were placed in this encounter.     Follow-Up Instructions: No follow-ups on file.   Bo Merino, MD  Note - This record has been created using Editor, commissioning.  Chart creation errors have been sought, but may not always  have been located. Such creation errors do not reflect on  the standard of medical care.

## 2020-03-14 ENCOUNTER — Other Ambulatory Visit: Payer: Self-pay

## 2020-03-14 ENCOUNTER — Encounter: Payer: Self-pay | Admitting: Rheumatology

## 2020-03-14 ENCOUNTER — Ambulatory Visit: Payer: Medicare Other | Admitting: Rheumatology

## 2020-03-14 VITALS — BP 137/100 | HR 104 | Resp 15 | Ht 66.0 in | Wt 206.8 lb

## 2020-03-14 DIAGNOSIS — M5134 Other intervertebral disc degeneration, thoracic region: Secondary | ICD-10-CM

## 2020-03-14 DIAGNOSIS — R5383 Other fatigue: Secondary | ICD-10-CM

## 2020-03-14 DIAGNOSIS — Z8781 Personal history of (healed) traumatic fracture: Secondary | ICD-10-CM

## 2020-03-14 DIAGNOSIS — M8589 Other specified disorders of bone density and structure, multiple sites: Secondary | ICD-10-CM

## 2020-03-14 DIAGNOSIS — Z856 Personal history of leukemia: Secondary | ICD-10-CM

## 2020-03-14 DIAGNOSIS — Z8639 Personal history of other endocrine, nutritional and metabolic disease: Secondary | ICD-10-CM

## 2020-03-14 DIAGNOSIS — M7061 Trochanteric bursitis, right hip: Secondary | ICD-10-CM

## 2020-03-14 DIAGNOSIS — G894 Chronic pain syndrome: Secondary | ICD-10-CM

## 2020-03-14 DIAGNOSIS — M797 Fibromyalgia: Secondary | ICD-10-CM | POA: Diagnosis not present

## 2020-03-14 DIAGNOSIS — F5101 Primary insomnia: Secondary | ICD-10-CM

## 2020-03-14 DIAGNOSIS — M5136 Other intervertebral disc degeneration, lumbar region: Secondary | ICD-10-CM

## 2020-03-14 DIAGNOSIS — Z9884 Bariatric surgery status: Secondary | ICD-10-CM

## 2020-03-14 DIAGNOSIS — Z8669 Personal history of other diseases of the nervous system and sense organs: Secondary | ICD-10-CM

## 2020-03-14 DIAGNOSIS — M7062 Trochanteric bursitis, left hip: Secondary | ICD-10-CM

## 2020-03-14 DIAGNOSIS — Z8659 Personal history of other mental and behavioral disorders: Secondary | ICD-10-CM

## 2020-03-14 NOTE — Patient Instructions (Signed)
Iliotibial Band Syndrome Rehab Ask your health care provider which exercises are safe for you. Do exercises exactly as told by your health care provider and adjust them as directed. It is normal to feel mild stretching, pulling, tightness, or discomfort as you do these exercises. Stop right away if you feel sudden pain or your pain gets significantly worse. Do not begin these exercises until told by your health care provider. Stretching and range-of-motion exercises These exercises warm up your muscles and joints and improve the movement and flexibility of your hip and pelvis. Quadriceps stretch, prone  1. Lie on your abdomen on a firm surface, such as a bed or padded floor (prone position). 2. Bend your left / right knee and reach back to hold your ankle or pant leg. If you cannot reach your ankle or pant leg, loop a belt around your foot and grab the belt instead. 3. Gently pull your heel toward your buttocks. Your knee should not slide out to the side. You should feel a stretch in the front of your thigh and knee (quadriceps). 4. Hold this position for __________ seconds. Repeat __________ times. Complete this exercise __________ times a day. Iliotibial band stretch An iliotibial band is a strong band of muscle tissue that runs from the outer side of your hip to the outer side of your thigh and knee. 1. Lie on your side with your left / right leg in the top position. 2. Bend both of your knees and grab your left / right ankle. Stretch out your bottom arm to help you balance. 3. Slowly bring your top knee back so your thigh goes behind your trunk. 4. Slowly lower your top leg toward the floor until you feel a gentle stretch on the outside of your left / right hip and thigh. If you do not feel a stretch and your knee will not fall farther, place the heel of your other foot on top of your knee and pull your knee down toward the floor with your foot. 5. Hold this position for __________  seconds. Repeat __________ times. Complete this exercise __________ times a day. Strengthening exercises These exercises build strength and endurance in your hip and pelvis. Endurance is the ability to use your muscles for a long time, even after they get tired. Straight leg raises, side-lying This exercise strengthens the muscles that rotate the leg at the hip and move it away from your body (hip abductors). 1. Lie on your side with your left / right leg in the top position. Lie so your head, shoulder, hip, and knee line up. You may bend your bottom knee to help you balance. 2. Roll your hips slightly forward so your hips are stacked directly over each other and your left / right knee is facing forward. 3. Tense the muscles in your outer thigh and lift your top leg 4-6 inches (10-15 cm). 4. Hold this position for __________ seconds. 5. Slowly return to the starting position. Let your muscles relax completely before doing another repetition. Repeat __________ times. Complete this exercise __________ times a day. Leg raises, prone This exercise strengthens the muscles that move the hips (hip extensors). 1. Lie on your abdomen on your bed or a firm surface. You can put a pillow under your hips if that is more comfortable for your lower back. 2. Bend your left / right knee so your foot is straight up in the air. 3. Squeeze your buttocks muscles and lift your left / right thigh   off the bed. Do not let your back arch. 4. Tense your thigh muscle as hard as you can without increasing any knee pain. 5. Hold this position for __________ seconds. 6. Slowly lower your leg to the starting position and allow it to relax completely. Repeat __________ times. Complete this exercise __________ times a day. Hip hike 1. Stand sideways on a bottom step. Stand on your left / right leg with your other foot unsupported next to the step. You can hold on to the railing or wall for balance if needed. 2. Keep your knees  straight and your torso square. Then lift your left / right hip up toward the ceiling. 3. Slowly let your left / right hip lower toward the floor, past the starting position. Your foot should get closer to the floor. Do not lean or bend your knees. Repeat __________ times. Complete this exercise __________ times a day. This information is not intended to replace advice given to you by your health care provider. Make sure you discuss any questions you have with your health care provider. Document Revised: 11/19/2018 Document Reviewed: 05/20/2018 Elsevier Patient Education  2020 [employer] Back Exercises The following exercises strengthen the muscles that help to support the trunk and back. They also help to keep the lower back flexible. Doing these exercises can help to prevent back pain or lessen existing pain.  If you have back pain or discomfort, try doing these exercises 2-3 times each day or as told by your health care provider.  As your pain improves, do them once each day, but increase the number of times that you repeat the steps for each exercise (do more repetitions).  To prevent the recurrence of back pain, continue to do these exercises once each day or as told by your health care provider. Do exercises exactly as told by your health care provider and adjust them as directed. It is normal to feel mild stretching, pulling, tightness, or discomfort as you do these exercises, but you should stop right away if you feel sudden pain or your pain gets worse. Exercises Single knee to chest Repeat these steps 3-5 times for each leg: 1. Lie on your back on a firm bed or the floor with your legs extended. 2. Bring one knee to your chest. Your other leg should stay extended and in contact with the floor. 3. Hold your knee in place by grabbing your knee or thigh with both hands and hold. 4. Pull on your knee until you feel a gentle stretch in your lower back or buttocks. 5. Hold the stretch  for 10-30 seconds. 6. Slowly release and straighten your leg. Pelvic tilt Repeat these steps 5-10 times: 1. Lie on your back on a firm bed or the floor with your legs extended. 2. Bend your knees so they are pointing toward the ceiling and your feet are flat on the floor. 3. Tighten your lower abdominal muscles to press your lower back against the floor. This motion will tilt your pelvis so your tailbone points up toward the ceiling instead of pointing to your feet or the floor. 4. With gentle tension and even breathing, hold this position for 5-10 seconds. Cat-cow Repeat these steps until your lower back becomes more flexible: 1. Get into a hands-and-knees position on a firm surface. Keep your hands under your shoulders, and keep your knees under your hips. You may place padding under your knees for comfort. 2. Let your head hang down toward your chest. Contract   your abdominal muscles and point your tailbone toward the floor so your lower back becomes rounded like the back of a cat. 3. Hold this position for 5 seconds. 4. Slowly lift your head, let your abdominal muscles relax and point your tailbone up toward the ceiling so your back forms a sagging arch like the back of a cow. 5. Hold this position for 5 seconds.  Press-ups Repeat these steps 5-10 times: 1. Lie on your abdomen (face-down) on the floor. 2. Place your palms near your head, about shoulder-width apart. 3. Keeping your back as relaxed as possible and keeping your hips on the floor, slowly straighten your arms to raise the top half of your body and lift your shoulders. Do not use your back muscles to raise your upper torso. You may adjust the placement of your hands to make yourself more comfortable. 4. Hold this position for 5 seconds while you keep your back relaxed. 5. Slowly return to lying flat on the floor.  Bridges Repeat these steps 10 times: 1. Lie on your back on a firm surface. 2. Bend your knees so they are  pointing toward the ceiling and your feet are flat on the floor. Your arms should be flat at your sides, next to your body. 3. Tighten your buttocks muscles and lift your buttocks off the floor until your waist is at almost the same height as your knees. You should feel the muscles working in your buttocks and the back of your thighs. If you do not feel these muscles, slide your feet 1-2 inches farther away from your buttocks. 4. Hold this position for 3-5 seconds. 5. Slowly lower your hips to the starting position, and allow your buttocks muscles to relax completely. If this exercise is too easy, try doing it with your arms crossed over your chest. Abdominal crunches Repeat these steps 5-10 times: 1. Lie on your back on a firm bed or the floor with your legs extended. 2. Bend your knees so they are pointing toward the ceiling and your feet are flat on the floor. 3. Cross your arms over your chest. 4. Tip your chin slightly toward your chest without bending your neck. 5. Tighten your abdominal muscles and slowly raise your trunk (torso) high enough to lift your shoulder blades a tiny bit off the floor. Avoid raising your torso higher than that because it can put too much stress on your low back and does not help to strengthen your abdominal muscles. 6. Slowly return to your starting position. Back lifts Repeat these steps 5-10 times: 1. Lie on your abdomen (face-down) with your arms at your sides, and rest your forehead on the floor. 2. Tighten the muscles in your legs and your buttocks. 3. Slowly lift your chest off the floor while you keep your hips pressed to the floor. Keep the back of your head in line with the curve in your back. Your eyes should be looking at the floor. 4. Hold this position for 3-5 seconds. 5. Slowly return to your starting position. Contact a health care provider if:  Your back pain or discomfort gets much worse when you do an exercise.  Your worsening back pain or  discomfort does not lessen within 2 hours after you exercise. If you have any of these problems, stop doing these exercises right away. Do not do them again unless your health care provider says that you can. Get help right away if:  You develop sudden, severe back pain. If this   happens, stop doing the exercises right away. Do not do them again unless your health care provider says that you can. This information is not intended to replace advice given to you by your health care provider. Make sure you discuss any questions you have with your health care provider. Document Revised: 12/03/2018 Document Reviewed: 04/30/2018 Elsevier Patient Education  [place]. Cervical Strain and Sprain Rehab Ask your health care provider which exercises are safe for you. Do exercises exactly as told by your health care provider and adjust them as directed. It is normal to feel mild stretching, pulling, tightness, or discomfort as you do these exercises. Stop right away if you feel sudden pain or your pain gets worse. Do not begin these exercises until told by your health care provider. Stretching and range-of-motion exercises Cervical side bending  5. Using good posture, sit on a stable chair or stand up. 6. Without moving your shoulders, slowly tilt your left / right ear to your shoulder until you feel a stretch in the opposite side neck muscles. You should be looking straight ahead. 7. Hold for __________ seconds. 8. Repeat with the other side of your neck. Repeat __________ times. Complete this exercise __________ times a day. Cervical rotation  6. Using good posture, sit on a stable chair or stand up. 7. Slowly turn your head to the side as if you are looking over your left / right shoulder. ? Keep your eyes level with the ground. ? Stop when you feel a stretch along the side and the back of your neck. 8. Hold for __________ seconds. 9. Repeat this by turning to your other side. Repeat  __________ times. Complete this exercise __________ times a day. Thoracic extension and pectoral stretch 6. Roll a towel or a small blanket so it is about 4 inches (10 cm) in diameter. 7. Lie down on your back on a firm surface. 8. Put the towel lengthwise, under your spine in the middle of your back. It should not be under your shoulder blades. The towel should line up with your spine from your middle back to your lower back. 9. Put your hands behind your head and let your elbows fall out to your sides. 10. Hold for __________ seconds. Repeat __________ times. Complete this exercise __________ times a day. Strengthening exercises Isometric upper cervical flexion 7. Lie on your back with a thin pillow behind your head and a small rolled-up towel under your neck. 8. Gently tuck your chin toward your chest and nod your head down to look toward your feet. Do not lift your head off the pillow. 9. Hold for __________ seconds. 10. Release the tension slowly. Relax your neck muscles completely before you repeat this exercise. Repeat __________ times. Complete this exercise __________ times a day. Isometric cervical extension  4. Stand about 6 inches (15 cm) away from a wall, with your back facing the wall. 5. Place a soft object, about 6-8 inches (15-20 cm) in diameter, between the back of your head and the wall. A soft object could be a small pillow, a ball, or a folded towel. 6. Gently tilt your head back and press into the soft object. Keep your jaw and forehead relaxed. 7. Hold for __________ seconds. 8. Release the tension slowly. Relax your neck muscles completely before you repeat this exercise. Repeat __________ times. Complete this exercise __________ times a day. Posture and body mechanics Body mechanics refers to the movements and positions of your body while you do  your daily activities. Posture is part of body mechanics. Good posture and healthy body mechanics can help to relieve stress  in your body's tissues and joints. Good posture means that your spine is in its natural S-curve position (your spine is neutral), your shoulders are pulled back slightly, and your head is not tipped forward. The following are general guidelines for applying improved posture and body mechanics to your everyday activities. Sitting  1. When sitting, keep your spine neutral and keep your feet flat on the floor. Use a footrest, if necessary, and keep your thighs parallel to the floor. Avoid rounding your shoulders, and avoid tilting your head forward. 2. When working at a desk or a computer, keep your desk at a height where your hands are slightly lower than your elbows. Slide your chair under your desk so you are close enough to maintain good posture. 3. When working at a computer, place your monitor at a height where you are looking straight ahead and you do not have to tilt your head forward or downward to look at the screen. Standing   When standing, keep your spine neutral and keep your feet about hip-width apart. Keep a slight bend in your knees. Your ears, shoulders, and hips should line up.  When you do a task in which you stand in one place for a long time, place one foot up on a stable object that is 2-4 inches (5-10 cm) high, such as a footstool. This helps keep your spine neutral. Resting When lying down and resting, avoid positions that are most painful for you. Try to support your neck in a neutral position. You can use a contour pillow or a small rolled-up towel. Your pillow should support your neck but not push on it. This information is not intended to replace advice given to you by your health care provider. Make sure you discuss any questions you have with your health care provider. Document Revised: 11/18/2018 Document Reviewed: 04/29/2018 Elsevier Patient Education  [place].

## 2020-03-20 ENCOUNTER — Other Ambulatory Visit: Payer: Self-pay | Admitting: Physician Assistant

## 2020-03-20 NOTE — Telephone Encounter (Signed)
Last Visit: 03/14/2020 Next Visit: February 2022. Message sent to the front to schedule patient  Current Dose per office note on 03/14/2020: Flexeril 10 mg 1 tablet po at bedtime as needed  Okay to refill Flexeril?

## 2020-03-20 NOTE — Telephone Encounter (Signed)
I LMOM for patient to call to schedule a follow up appointment for February, 2022.

## 2020-03-20 NOTE — Telephone Encounter (Signed)
Please schedule patient for a follow up visit. Patient due February 2022. Thanks!  

## 2020-04-06 ENCOUNTER — Telehealth: Payer: Self-pay | Admitting: Neurology

## 2020-04-06 MED ORDER — BOTOX 200 UNITS IJ SOLR: INTRAMUSCULAR | 2 refills | Status: AC

## 2020-04-06 NOTE — Telephone Encounter (Signed)
Patient has appointment on 8/31. I called [employer] [phone]) and spoke with Merrilee Seashore to see if codes [phone] and [phone] require PA. He states they do not. Patient can be B/B or use [facility]. Reference [phone].  Romelle Starcher, can you send patient's Botox prescription to [place]? She will be B/B for upcoming appointment but we will use SP for future injections.

## 2020-04-10 NOTE — Progress Notes (Signed)
Consent Form Botulism Toxin Injection For Chronic Migraine   Interval history 04/11/2020: ZERO migraine in 3 months AGAIN, fantastic, she says a "miracle".  This is absolutely hilarious 54 year old lovelypatient who is here for her 5th botox. She has 0 migraine since last treatment. Excellent. 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  Her baseline is 20-25 migraines a month.  +a.She has pain in the levators/romboids will also place some there. SHE ALSO HAS A SPOCK ON THE LEFT, PUT 2 UNITS THERE MAY NEED MORE.  Reviewed orally with patient, additionally signature is on file:  Botulism toxin has been approved by the Federal drug administration for treatment of chronic migraine. Botulism toxin does not cure chronic migraine and it may not be effective in some patients.  The administration of botulism toxin is accomplished by injecting a small amount of toxin into the muscles of the neck and head. Dosage must be titrated for each individual. Any benefits resulting from botulism toxin tend to wear off after 3 months with a repeat injection required if benefit is to be maintained. Injections are usually done every 3-4 months with maximum effect peak achieved by about 2 or 3 weeks. Botulism toxin is expensive and you should be sure of what costs you will incur resulting from the injection.  The side effects of botulism toxin use for chronic migraine may include:   -Transient, and usually mild, facial weakness with facial injections  -Transient, and usually mild, head or neck weakness with head/neck injections  -Reduction or loss of forehead facial animation due to forehead muscle weakness  -Eyelid drooping  -Dry eye  -Pain at the site of injection or bruising at the site of injection  -Double vision  -Potential unknown long term risks  Contraindications: You should not have Botox if you are pregnant, nursing, allergic to albumin, have an infection, skin  condition, or muscle weakness at the site of the injection, or have myasthenia gravis, Lambert-Eaton syndrome, or ALS.  It is also possible that as with any injection, there may be an allergic reaction or no effect from the medication. Reduced effectiveness after repeated injections is sometimes seen and rarely infection at the injection site may occur. All care will be taken to prevent these side effects. If therapy is given over a long time, atrophy and wasting in the muscle injected may occur. Occasionally the patient's become refractory to treatment because they develop antibodies to the toxin. In this event, therapy needs to be modified.  I have read the above information and consent to the administration of botulism toxin.    BOTOX PROCEDURE NOTE FOR MIGRAINE HEADACHE    Contraindications and precautions discussed with patient(above). Aseptic procedure was observed and patient tolerated procedure. Procedure performed by Dr. Georgia Dom  The condition has existed for more than 6 months, and pt does not have a diagnosis of ALS, Myasthenia Gravis or Lambert-Eaton Syndrome.  Risks and benefits of injections discussed and pt agrees to proceed with the procedure.  Written consent obtained  These injections are medically necessary. Pt  receives good benefits from these injections. These injections do not cause sedations or hallucinations which the oral therapies may cause.  Description of procedure:  The patient was placed in a sitting position. The standard protocol was used for Botox as follows, with 5 units of Botox injected at each site:   -Procerus muscle, midline injection  -Corrugator muscle, bilateral injection  -Frontalis  muscle, bilateral injection, with 2 sites each side, medial injection was performed in the upper one third of the frontalis muscle, in the region vertical from the medial inferior edge of the superior orbital rim. The lateral injection was again in the upper one  third of the forehead vertically above the lateral limbus of the cornea, 1.5 cm lateral to the medial injection site.  -Temporalis muscle injection, 4 sites, bilaterally. The first injection was 3 cm above the tragus of the ear, second injection site was 1.5 cm to 3 cm up from the first injection site in line with the tragus of the ear. The third injection site was 1.5-3 cm forward between the first 2 injection sites. The fourth injection site was 1.5 cm posterior to the second injection site.  - Patient feels her clenching is a trigger for headaches. +5 units masseter bilaterally   - Levator Scapula +10 Units each  -Occipitalis muscle injection, 3 sites, bilaterally. The first injection was done one half way between the occipital protuberance and the tip of the mastoid process behind the ear. The second injection site was done lateral and superior to the first, 1 fingerbreadth from the first injection. The third injection site was 1 fingerbreadth superiorly and medially from the first injection site.  -Cervical paraspinal muscle injection, 2 sites, bilateral knee first injection site was 1 cm from the midline of the cervical spine, 3 cm inferior to the lower border of the occipital protuberance. The second injection site was 1.5 cm superiorly and laterally to the first injection site.  -Trapezius muscle injection was performed at 3 sites, bilaterally. The first injection site was in the upper trapezius muscle halfway between the inflection point of the neck, and the acromion. The second injection site was one half way between the acromion and the first injection site. The third injection was done between the first injection site and the inflection point of the neck.   Will return for repeat injection in 3 months.   A 200 unit sof Botox was used, any Botox not injected was wasted. The patient tolerated the procedure well, there were no complications of the above procedure.

## 2020-04-11 ENCOUNTER — Ambulatory Visit: Payer: Medicare Other | Admitting: Neurology

## 2020-04-11 DIAGNOSIS — G43711 Chronic migraine without aura, intractable, with status migrainosus: Secondary | ICD-10-CM | POA: Diagnosis not present

## 2020-04-11 NOTE — Progress Notes (Signed)
Botox- 200 units x 1 vial Lot: [id number] Expiration: 11/2022 NDC: 7681-1572-62  Bacteriostatic 0.9% Sodium Chloride- 55mL total Lot: [id number] Expiration: 05/12/2021 NDC: 4163-8453-64  Dx: W80.321 B/B

## 2020-04-14 ENCOUNTER — Other Ambulatory Visit: Payer: Self-pay | Admitting: Physician Assistant

## 2020-04-14 NOTE — Telephone Encounter (Signed)
Last Visit: 03/14/2020 Next Visit: February 2022. Message sent to the front to schedule patient  Okay to refill Lyrica?

## 2020-04-18 NOTE — Telephone Encounter (Signed)
Patient is not on MTX. Attempted to contact the patient to verify which prescriptions she needs refilled.

## 2020-04-18 NOTE — Telephone Encounter (Signed)
Patient requesting a refill on Lyrica, MTX, and methocarbamol sent to [facility] at [facility]. Patient is moving Thursday.

## 2020-04-19 ENCOUNTER — Telehealth: Payer: Self-pay | Admitting: Rheumatology

## 2020-04-19 MED ORDER — CYCLOBENZAPRINE HCL 10 MG PO TABS: 10.0000 mg | ORAL_TABLET | Freq: Every day | ORAL | 0 refills | Status: AC

## 2020-04-19 MED ORDER — METHOCARBAMOL 750 MG PO TABS: 750.0000 mg | ORAL_TABLET | Freq: Two times a day (BID) | ORAL | 0 refills | Status: AC

## 2020-04-19 NOTE — Telephone Encounter (Signed)
See previous phone note.  

## 2020-04-19 NOTE — Telephone Encounter (Signed)
Patient left a voicemail stating she was returning your call.   

## 2020-04-19 NOTE — Addendum Note (Signed)
Addended by: Carole Binning on: 04/19/2020 09:33 AM   Modules accepted: Orders

## 2020-04-19 NOTE — Telephone Encounter (Signed)
Patient states she needs refill on Methocarbamol and Flexeril.   Last Visit: 03/14/2020 Next Visit: February 2022. Message sent to the front to schedule patient (Patient moving and will find care at new residence)  Okay to refill Methocarbamol and Flexeril?

## 2020-04-21 ENCOUNTER — Telehealth: Payer: Self-pay | Admitting: *Deleted

## 2020-04-21 NOTE — Telephone Encounter (Signed)
Patient advised unable to prescribe muscle relaxer any longer.

## 2020-04-21 NOTE — Telephone Encounter (Signed)
Received a fax from [facility] regarding a Opioid Risk Management: Overlapping days supply of muscle relaxants.  Reviewed by Dr. Estanislado Pandy: Patient receiving multiple prescriptions for muscle relaxer from multiple doctors.   Recommendation: Unable to prescribe muscle relaxer any longer.

## 2020-12-13 ENCOUNTER — Telehealth: Payer: Self-pay | Admitting: Neurology

## 2020-12-13 NOTE — Telephone Encounter (Signed)
There is an organization called [employer] that administers and maintains board certification for headache specialists. If she goes to this website and selects Headache Medicine and a city/state it will give her all the neurologists in her area that are specialty certified for headache. I none are in her city she may have to try some cities around her. thanks  [email or web address]

## 2020-12-13 NOTE — Telephone Encounter (Signed)
Spoke with patient and discussed the website below that Dr. Jaynee Eagles suggested she go to to look for a neurologist who is board-certified in headache.  The patient verbalized appreciation.  I let her know I would send the website to her MyChart. The patient asked about a referral being sent but I advised that she needs to have her primary care send the referral and then of course they can reach out to our office to obtain notes.  The patient verbalized understanding and appreciation for the call.

## 2020-12-13 NOTE — Telephone Encounter (Signed)
Pt called, have moved to [facility]. Dr. Jaynee Eagles had let me know she would help me found a neurologist to send a referral. Would like a call from the nurse.

## 2022-03-29 ENCOUNTER — Encounter: Payer: Self-pay | Admitting: Gastroenterology
